# Patient Record
Sex: Female | Born: 1985 | Race: Black or African American | Hispanic: No | Marital: Single | Smoking: Never smoker
Health system: Southern US, Community
[De-identification: ages and names within clinical notes are randomized; demographics above are authoritative.]

## PROBLEM LIST (undated history)

## (undated) DIAGNOSIS — K219 Gastro-esophageal reflux disease without esophagitis: Secondary | ICD-10-CM

## (undated) DIAGNOSIS — L309 Dermatitis, unspecified: Secondary | ICD-10-CM

## (undated) DIAGNOSIS — G44009 Cluster headache syndrome, unspecified, not intractable: Secondary | ICD-10-CM

## (undated) HISTORY — DX: Dermatitis, unspecified: L30.9

## (undated) HISTORY — PX: TONSILLECTOMY: SUR1361

---
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
## 2009-10-05 ENCOUNTER — Inpatient Hospital Stay (HOSPITAL_COMMUNITY): Admission: AD | Admit: 2009-10-05 | Discharge: 2009-10-05 | Payer: Self-pay | Admitting: Family Medicine

## 2009-10-21 ENCOUNTER — Inpatient Hospital Stay (HOSPITAL_COMMUNITY): Admission: AD | Admit: 2009-10-21 | Discharge: 2009-10-21 | Payer: Self-pay | Admitting: Obstetrics & Gynecology

## 2010-06-07 ENCOUNTER — Ambulatory Visit: Payer: Self-pay | Admitting: Family Medicine

## 2010-06-07 ENCOUNTER — Other Ambulatory Visit: Admission: RE | Admit: 2010-06-07 | Discharge: 2010-06-07 | Payer: Self-pay | Admitting: Family Medicine

## 2010-06-07 DIAGNOSIS — L259 Unspecified contact dermatitis, unspecified cause: Secondary | ICD-10-CM

## 2010-06-07 DIAGNOSIS — J45909 Unspecified asthma, uncomplicated: Secondary | ICD-10-CM | POA: Insufficient documentation

## 2010-06-08 LAB — CONVERTED CEMR LAB
AST: 14 units/L (ref 0–37)
Alkaline Phosphatase: 48 units/L (ref 39–117)
BUN: 9 mg/dL (ref 6–23)
Basophils Absolute: 0 10*3/uL (ref 0.0–0.1)
Calcium: 9.3 mg/dL (ref 8.4–10.5)
Cholesterol: 150 mg/dL (ref 0–200)
GFR calc non Af Amer: 131.15 mL/min (ref >60)
Glucose, Bld: 73 mg/dL (ref 70–99)
HDL: 51.8 mg/dL (ref >39.00)
Lymphocytes Relative: 26.8 % (ref 12.0–46.0)
Monocytes Relative: 6.1 % (ref 3.0–12.0)
Neutrophils Relative %: 60.3 % (ref 43.0–77.0)
Platelets: 332 10*3/uL (ref 150.0–400.0)
RDW: 15.9 % — ABNORMAL HIGH (ref 11.5–14.6)
Sodium: 131 meq/L — ABNORMAL LOW (ref 135–145)
Total Bilirubin: 0.4 mg/dL (ref 0.3–1.2)
VLDL: 13.2 mg/dL (ref 0.0–40.0)

## 2010-08-05 ENCOUNTER — Inpatient Hospital Stay (HOSPITAL_COMMUNITY)
Admission: AD | Admit: 2010-08-05 | Discharge: 2010-08-05 | Payer: Self-pay | Source: Home / Self Care | Attending: Obstetrics and Gynecology | Admitting: Obstetrics and Gynecology

## 2010-08-12 NOTE — L&D Delivery Note (Signed)
Delivery Note At 1:03 AM a viable female was delivered via Vaginal, Spontaneous Delivery (Presentation: Middle Occiput Anterior).  APGAR: 9, 9; weight 8 lb 2.1 oz (3689 g).   Placenta status: Intact, Spontaneous.  Cord: 3 vessels with the following complications: None.  Anesthesia: Epidural  Episiotomy: None Lacerations: 2nd degree;Perineal Suture Repair: 3.0 Est. Blood Loss (mL):   Mom to postpartum.  Baby to nursery-stable.  Aneita Kiger 02/20/2011, 2:59 AM   Fetal tachycardia in last 30 minutes prior to delivery. Infant vigorous, placed skin-to-skin with mom, hot to touch. Mild odor to fluid at delivery. EBL: 400 Pt plans to breast feed. Desires IP circ. Placenta to pathology for suspected mild chorio.

## 2010-09-13 NOTE — Assessment & Plan Note (Signed)
Summary: NEW TO EST//FOR Beverly Scott FOR JOB//PH   Vital Signs:  Patient profile:   25 year old female Menstrual status:  regular LMP:     05/13/2010 Height:      64 inches Weight:      156.2 pounds BMI:     26.91 Temp:     97.6 degrees F oral Pulse rate:   80 / minute Pulse rhythm:   regular BP sitting:   100 / 68  (right arm) Cuff size:   regular  Vitals Entered By: Almeta Monas CMA Duncan Dull) (June 07, 2010 9:05 AM) CC: new est care, Cpx wants to get a pap today LMP (date): 05/13/2010     Menstrual Status regular Enter LMP: 05/13/2010   History of Present Illness: Pt here for cpe , pap and labs.  No complaints.    Preventive Screening-Counseling & Management  Alcohol-Tobacco     Alcohol drinks/day: <1     Smoking Status: never  Caffeine-Diet-Exercise     Caffeine use/day: 1     Does Patient Exercise: yes     Type of exercise: run     Exercise (avg: min/session): 30-60     Times/week: 3  Hep-HIV-STD-Contraception     Dental Visit-last 6 months no     Dental Care Counseling: to seek dental care; no dental care within six months     SBE monthly: no     SBE Education/Counseling: to perform regular SBE      Sexual History:  currently monogamous.        Drug Use:  no.    Current Medications (verified): 1)  Ventolin Hfa 108 (90 Base) Mcg/act Aers (Albuterol Sulfate) .... 2 Puffs Every 4 Hours As Needed 2)  Triamcinolone Acetonide 0.5 % Crea (Triamcinolone Acetonide) .... Apply To Affected Area Two Times A Day As Needed  Allergies (verified): 1)  ! Fluzone (Influenza Virus Vaccine Split) 2)  ! * Eggs  Past History:  Family History: Last updated: 06/07/2010 Family History of CAD Female 1st degree relative <60--Mother Family History Diabetes 1st degree relative--Mother Family History High cholesterol--Mother Family History Hypertension--Mother Family History of Sudden Death-, CHF-Mother  Social History: Last updated: 06/07/2010 Occupation:  CMA Single Never Smoked Alcohol use-yes-- Socially Drug use-no Regular exercise-yes--2x's per week  Risk Factors: Alcohol Use: <1 (06/07/2010) Caffeine Use: 1 (06/07/2010) Exercise: yes (06/07/2010)  Risk Factors: Smoking Status: never (06/07/2010)  Past Medical History: Asthma Eczema  Past Surgical History: Tonsillectomy-- 1990  Family History: Reviewed history and no changes required. Family History of CAD Female 1st degree relative <60--Mother Family History Diabetes 1st degree relative--Mother Family History High cholesterol--Mother Family History Hypertension--Mother Family History of Sudden Death-, CHF-Mother  Social History: Reviewed history and no changes required. Occupation: CMA Single Never Smoked Alcohol use-yes-- Socially Drug use-no Regular exercise-yes--2x's per week Occupation:  employed Smoking Status:  never Drug Use:  no Does Patient Exercise:  yes Alcohol:  Less than 3 drinks per week Caffeine use/day:  1 Dental Care w/in 6 mos.:  no Sexual History:  currently monogamous  Review of Systems      See HPI General:  Denies chills, fatigue, fever, loss of appetite, malaise, sleep disorder, sweats, weakness, and weight loss. Eyes:  Denies blurring, discharge, double vision, eye irritation, eye pain, halos, itching, light sensitivity, red eye, vision loss-1 eye, and vision loss-both eyes; optho--q2y. ENT:  Denies decreased hearing, difficulty swallowing, ear discharge, earache, hoarseness, nasal congestion, nosebleeds, postnasal drainage, ringing in ears, sinus pressure, and sore throat. CV:  Denies bluish discoloration of lips or nails, chest pain or discomfort, difficulty breathing at night, difficulty breathing while lying down, fainting, fatigue, leg cramps with exertion, lightheadness, near fainting, palpitations, shortness of breath with exertion, swelling of feet, swelling of hands, and weight gain. Resp:  Denies chest discomfort, chest pain  with inspiration, cough, coughing up blood, excessive snoring, hypersomnolence, morning headaches, pleuritic, shortness of breath, sputum productive, and wheezing. GI:  Denies abdominal pain, bloody stools, change in bowel habits, constipation, dark tarry stools, diarrhea, excessive appetite, gas, hemorrhoids, indigestion, loss of appetite, and nausea. GU:  Denies abnormal vaginal bleeding, decreased libido, discharge, dysuria, genital sores, hematuria, incontinence, nocturia, urinary frequency, and urinary hesitancy. MS:  Denies joint pain, joint redness, joint swelling, loss of strength, low back pain, mid back pain, muscle aches, muscle , cramps, muscle weakness, stiffness, and thoracic pain. Derm:  Denies changes in color of skin, changes in nail beds, dryness, excessive perspiration, flushing, hair loss, insect bite(s), itching, lesion(s), poor wound healing, and rash. Neuro:  Denies brief paralysis, difficulty with concentration, disturbances in coordination, falling down, headaches, inability to speak, memory loss, numbness, poor balance, seizures, sensation of room spinning, tingling, tremors, visual disturbances, and weakness. Psych:  Denies alternate hallucination ( auditory/visual), anxiety, depression, easily angered, easily tearful, irritability, mental problems, panic attacks, sense of great danger, suicidal thoughts/plans, thoughts of violence, unusual visions or sounds, and thoughts /plans of harming others. Endo:  Denies cold intolerance, excessive hunger, excessive thirst, excessive urination, heat intolerance, polyuria, and weight change. Heme:  Denies abnormal bruising, bleeding, enlarge lymph nodes, fevers, pallor, and skin discoloration. Allergy:  Denies hives or rash, itching eyes, persistent infections, seasonal allergies, and sneezing.  Physical Exam  General:  Well-developed,well-nourished,in no acute distress; alert,appropriate and cooperative throughout examination Head:   Normocephalic and atraumatic without obvious abnormalities. No apparent alopecia or balding. Eyes:  vision grossly intact, pupils equal, pupils round, pupils reactive to light, and no injection.   Ears:  External ear exam shows no significant lesions or deformities.  Otoscopic examination reveals clear canals, tympanic membranes are intact bilaterally without bulging, retraction, inflammation or discharge. Hearing is grossly normal bilaterally. Nose:  External nasal examination shows no deformity or inflammation. Nasal mucosa are pink and moist without lesions or exudates. Mouth:  Oral mucosa and oropharynx without lesions or exudates.  Teeth in good repair. Neck:  No deformities, masses, or tenderness noted. Chest Wall:  No deformities, masses, or tenderness noted. Breasts:  No mass, nodules, thickening, tenderness, bulging, retraction, inflamation, nipple discharge or skin changes noted.   Lungs:  Normal respiratory effort, chest expands symmetrically. Lungs are clear to auscultation, no crackles or wheezes. Heart:  Normal rate and regular rhythm. S1 and S2 normal without gallop, murmur, click, rub or other extra sounds. Abdomen:  Bowel sounds positive,abdomen soft and non-tender without masses, organomegaly or hernias noted. Genitalia:  Pelvic Exam:        External: normal female genitalia without lesions or masses        Vagina: normal without lesions or masses        Cervix: normal without lesions or masses        Adnexa: normal bimanual exam without masses or fullness        Uterus: normal by palpation        Pap smear: performed  white d/c--scant amount, no odor Msk:  normal ROM, no joint tenderness, no joint swelling, no joint warmth, no redness over joints, no joint deformities, no joint instability, and  no crepitation.   Pulses:  R and L carotid,radial,femoral,dorsalis pedis and posterior tibial pulses are full and equal bilaterally Extremities:  No clubbing, cyanosis, edema, or  deformity noted with normal full range of motion of all joints.   Neurologic:  No cranial nerve deficits noted. Station and gait are normal. Plantar reflexes are down-going bilaterally. DTRs are symmetrical throughout. Sensory, motor and coordinative functions appear intact. Skin:  Intact without suspicious lesions or rashes Cervical Nodes:  No lymphadenopathy noted Axillary Nodes:  No palpable lymphadenopathy Psych:  Cognition and judgment appear intact. Alert and cooperative with normal attention span and concentration. No apparent delusions, illusions, hallucinations   Impression & Recommendations:  Problem # 1:  PREVENTIVE HEALTH CARE (ICD-V70.0) ghm utd pt is allergic to flu shots Orders: Venipuncture (04540) TLB-Lipid Panel (80061-LIPID) TLB-BMP (Basic Metabolic Panel-BMET) (80048-METABOL) TLB-CBC Platelet - w/Differential (85025-CBCD) TLB-Hepatic/Liver Function Pnl (80076-HEPATIC) TLB-TSH (Thyroid Stimulating Hormone) (84443-TSH)  Problem # 2:  Hx of ECZEMA (ICD-692.9)  Her updated medication list for this problem includes:    Triamcinolone Acetonide 0.5 % Crea (Triamcinolone acetonide) .Marland Kitchen... Apply to affected area two times a day as needed  Discussed avoidance of triggers and symptomatic treatment.   Complete Medication List: 1)  Ventolin Hfa 108 (90 Base) Mcg/act Aers (Albuterol sulfate) .... 2 puffs every 4 hours as needed 2)  Triamcinolone Acetonide 0.5 % Crea (Triamcinolone acetonide) .... Apply to affected area two times a day as needed  Patient Instructions: 1)  Please schedule a follow-up appointment in 1 year.  Prescriptions: TRIAMCINOLONE ACETONIDE 0.5 % CREA (TRIAMCINOLONE ACETONIDE) APPLY TO AFFECTED AREA two times a day as needed  #45g x 3   Entered and Authorized by:   Loreen Freud DO   Signed by:   Loreen Freud DO on 06/07/2010   Method used:   Electronically to        [facility] # [phone]* (retail)       [address] [place], [place]   [address]       Ph: [phone]       Fax: [phone]   RxID:   [id number]    Orders Added: 1)  Venipuncture [66440] 2)  TLB-Lipid Panel [80061-LIPID] 3)  TLB-BMP (Basic Metabolic Panel-BMET) [80048-METABOL] 4)  TLB-CBC Platelet - w/Differential [85025-CBCD] 5)  TLB-Hepatic/Liver Function Pnl [80076-HEPATIC] 6)  TLB-TSH (Thyroid Stimulating Hormone) [84443-TSH] 7)  New Patient 18-39 years [99385]   Immunization History:  Tetanus/Td Immunization History:    Tetanus/Td:  tdap (01/12/2009)   Immunization History:  Tetanus/Td Immunization History:    Tetanus/Td:  Tdap (01/12/2009)    Flu Vaccine Next Due:  Refused

## 2010-09-13 NOTE — Miscellaneous (Signed)
Summary: Vaccine Records  Vaccine Records   Imported By: Lanelle Bal 06/15/2010 09:02:06  _____________________________________________________________________  External Attachment:    Type:   Image     Comment:   External Document

## 2010-10-22 LAB — URINALYSIS, ROUTINE W REFLEX MICROSCOPIC
Glucose, UA: NEGATIVE mg/dL
Hgb urine dipstick: NEGATIVE
Specific Gravity, Urine: >1.03 — ABNORMAL HIGH (ref 1.005–1.030)
Urobilinogen, UA: 0.2 mg/dL (ref 0.0–1.0)

## 2010-10-22 LAB — DIFFERENTIAL
Basophils Absolute: 0 10*3/uL (ref 0.0–0.1)
Basophils Relative: 0 % (ref 0–1)
Eosinophils Absolute: 0.3 10*3/uL (ref 0.0–0.7)
Eosinophils Relative: 3 % (ref 0–5)
Monocytes Absolute: 0.5 10*3/uL (ref 0.1–1.0)
Monocytes Relative: 5 % (ref 3–12)

## 2010-10-22 LAB — GC/CHLAMYDIA PROBE AMP, GENITAL
Chlamydia, DNA Probe: POSITIVE — AB
GC Probe Amp, Genital: NEGATIVE

## 2010-10-22 LAB — COMPREHENSIVE METABOLIC PANEL
ALT: 11 U/L (ref 0–35)
AST: 20 U/L (ref 0–37)
Albumin: 3.3 g/dL — ABNORMAL LOW (ref 3.5–5.2)
Alkaline Phosphatase: 42 U/L (ref 39–117)
BUN: 2 mg/dL — ABNORMAL LOW (ref 6–23)
Chloride: 103 mEq/L (ref 96–112)
GFR calc Af Amer: >60 mL/min (ref >60)
Potassium: 3.5 mEq/L (ref 3.5–5.1)
Sodium: 135 mEq/L (ref 135–145)
Total Bilirubin: 0.4 mg/dL (ref 0.3–1.2)
Total Protein: 6.2 g/dL (ref 6.0–8.3)

## 2010-10-22 LAB — CBC
MCV: 81.8 fL (ref 78.0–100.0)
Platelets: 314 10*3/uL (ref 150–400)
RBC: 4.35 MIL/uL (ref 3.87–5.11)
WBC: 10.6 10*3/uL — ABNORMAL HIGH (ref 4.0–10.5)

## 2010-10-22 LAB — WET PREP, GENITAL
Trich, Wet Prep: NONE SEEN
Yeast Wet Prep HPF POC: NONE SEEN

## 2010-10-30 ENCOUNTER — Inpatient Hospital Stay (HOSPITAL_COMMUNITY)
Admission: AD | Admit: 2010-10-30 | Discharge: 2010-10-30 | Disposition: A | Discharge status: Home / Self Care | Payer: BC Managed Care – PPO | Source: Ambulatory Visit | Attending: Obstetrics and Gynecology | Admitting: Obstetrics and Gynecology

## 2010-10-30 DIAGNOSIS — K5289 Other specified noninfective gastroenteritis and colitis: Secondary | ICD-10-CM | POA: Insufficient documentation

## 2010-10-30 DIAGNOSIS — O99891 Other specified diseases and conditions complicating pregnancy: Secondary | ICD-10-CM | POA: Insufficient documentation

## 2010-10-30 DIAGNOSIS — O212 Late vomiting of pregnancy: Secondary | ICD-10-CM | POA: Insufficient documentation

## 2010-10-30 LAB — COMPREHENSIVE METABOLIC PANEL
Albumin: 1.6 g/dL — ABNORMAL LOW (ref 3.5–5.2)
Alkaline Phosphatase: 94 U/L (ref 39–117)
BUN: 6 mg/dL (ref 6–23)
Chloride: 116 mEq/L — ABNORMAL HIGH (ref 96–112)
Creatinine, Ser: 0.91 mg/dL (ref 0.4–1.2)
Glucose, Bld: 57 mg/dL — ABNORMAL LOW (ref 70–99)
Potassium: 4.4 mEq/L (ref 3.5–5.1)
Total Bilirubin: 0.9 mg/dL (ref 0.3–1.2)
Total Protein: 5.9 g/dL — ABNORMAL LOW (ref 6.0–8.3)

## 2010-10-30 LAB — URINALYSIS, ROUTINE W REFLEX MICROSCOPIC
Bilirubin Urine: NEGATIVE
Ketones, ur: NEGATIVE mg/dL
Nitrite: NEGATIVE
Protein, ur: NEGATIVE mg/dL
pH: 7 (ref 5.0–8.0)

## 2010-10-30 LAB — CBC
MCH: 29.5 pg (ref 26.0–34.0)
MCHC: 33.2 g/dL (ref 30.0–36.0)
MCV: 88.9 fL (ref 78.0–100.0)
Platelets: 290 10*3/uL (ref 150–400)

## 2010-11-02 LAB — URINALYSIS, ROUTINE W REFLEX MICROSCOPIC
Bilirubin Urine: NEGATIVE
Ketones, ur: NEGATIVE mg/dL
Nitrite: NEGATIVE
Protein, ur: NEGATIVE mg/dL
Urobilinogen, UA: 0.2 mg/dL (ref 0.0–1.0)

## 2010-11-02 LAB — CBC
HCT: 36.7 % (ref 36.0–46.0)
Hemoglobin: 12.2 g/dL (ref 12.0–15.0)
MCHC: 33.2 g/dL (ref 30.0–36.0)
MCV: 86.7 fL (ref 78.0–100.0)
RBC: 4.24 MIL/uL (ref 3.87–5.11)
RDW: 14.1 % (ref 11.5–15.5)

## 2010-11-02 LAB — ABO/RH: ABO/RH(D): A POS

## 2010-11-02 LAB — GC/CHLAMYDIA PROBE AMP, GENITAL
Chlamydia, DNA Probe: NEGATIVE
GC Probe Amp, Genital: NEGATIVE

## 2010-11-05 LAB — URINALYSIS, ROUTINE W REFLEX MICROSCOPIC
Bilirubin Urine: NEGATIVE
Glucose, UA: NEGATIVE mg/dL
Hgb urine dipstick: NEGATIVE
Nitrite: NEGATIVE
Specific Gravity, Urine: >1.03 — ABNORMAL HIGH (ref 1.005–1.030)
pH: 5.5 (ref 5.0–8.0)

## 2010-11-07 ENCOUNTER — Encounter: Payer: Self-pay | Admitting: Family Medicine

## 2010-11-07 ENCOUNTER — Other Ambulatory Visit: Payer: Self-pay | Admitting: *Deleted

## 2010-11-07 MED ORDER — ALBUTEROL SULFATE HFA 108 (90 BASE) MCG/ACT IN AERS: 2.0000 | INHALATION_SPRAY | RESPIRATORY_TRACT | Status: DC | PRN

## 2010-11-17 ENCOUNTER — Inpatient Hospital Stay (HOSPITAL_COMMUNITY): Payer: BC Managed Care – PPO

## 2010-11-17 ENCOUNTER — Inpatient Hospital Stay (HOSPITAL_COMMUNITY)
Admission: AD | Admit: 2010-11-17 | Discharge: 2010-11-17 | Disposition: A | Discharge status: Home / Self Care | Payer: BC Managed Care – PPO | Source: Ambulatory Visit | Attending: Obstetrics and Gynecology | Admitting: Obstetrics and Gynecology

## 2010-11-17 DIAGNOSIS — O4700 False labor before 37 completed weeks of gestation, unspecified trimester: Secondary | ICD-10-CM | POA: Insufficient documentation

## 2010-11-17 LAB — URINALYSIS, ROUTINE W REFLEX MICROSCOPIC
Glucose, UA: NEGATIVE mg/dL
Ketones, ur: 15 mg/dL — AB
Protein, ur: NEGATIVE mg/dL
Urobilinogen, UA: 0.2 mg/dL (ref 0.0–1.0)

## 2010-11-17 LAB — WET PREP, GENITAL
Trich, Wet Prep: NONE SEEN
Yeast Wet Prep HPF POC: NONE SEEN

## 2010-11-17 LAB — URINE MICROSCOPIC-ADD ON

## 2010-11-17 LAB — FETAL FIBRONECTIN: Fetal Fibronectin: NEGATIVE

## 2010-11-19 LAB — STREP B DNA PROBE: Strep Group B Ag: NEGATIVE

## 2010-11-19 LAB — GC/CHLAMYDIA PROBE AMP, GENITAL: Chlamydia, DNA Probe: NEGATIVE

## 2010-12-25 ENCOUNTER — Other Ambulatory Visit (HOSPITAL_COMMUNITY): Payer: Self-pay | Admitting: Obstetrics and Gynecology

## 2010-12-25 DIAGNOSIS — O358XX0 Maternal care for other (suspected) fetal abnormality and damage, not applicable or unspecified: Secondary | ICD-10-CM

## 2010-12-25 DIAGNOSIS — Z0489 Encounter for examination and observation for other specified reasons: Secondary | ICD-10-CM

## 2010-12-28 ENCOUNTER — Ambulatory Visit (HOSPITAL_COMMUNITY)
Admission: RE | Admit: 2010-12-28 | Discharge: 2010-12-28 | Disposition: A | Discharge status: Home / Self Care | Payer: BC Managed Care – PPO | Source: Ambulatory Visit | Attending: Obstetrics and Gynecology | Admitting: Obstetrics and Gynecology

## 2010-12-28 ENCOUNTER — Encounter (HOSPITAL_COMMUNITY): Payer: Self-pay

## 2010-12-28 DIAGNOSIS — IMO0002 Reserved for concepts with insufficient information to code with codable children: Secondary | ICD-10-CM

## 2010-12-28 DIAGNOSIS — O35EXX0 Maternal care for other (suspected) fetal abnormality and damage, fetal genitourinary anomalies, not applicable or unspecified: Secondary | ICD-10-CM

## 2010-12-28 DIAGNOSIS — O358XX0 Maternal care for other (suspected) fetal abnormality and damage, not applicable or unspecified: Secondary | ICD-10-CM | POA: Insufficient documentation

## 2010-12-28 DIAGNOSIS — Z0489 Encounter for examination and observation for other specified reasons: Secondary | ICD-10-CM

## 2010-12-28 DIAGNOSIS — Z3689 Encounter for other specified antenatal screening: Secondary | ICD-10-CM | POA: Insufficient documentation

## 2010-12-31 ENCOUNTER — Ambulatory Visit (HOSPITAL_COMMUNITY)
Admission: RE | Admit: 2010-12-31 | Discharge: 2010-12-31 | Disposition: A | Discharge status: Home / Self Care | Payer: BC Managed Care – PPO | Source: Ambulatory Visit | Attending: Obstetrics and Gynecology | Admitting: Obstetrics and Gynecology

## 2010-12-31 DIAGNOSIS — M79609 Pain in unspecified limb: Secondary | ICD-10-CM

## 2010-12-31 DIAGNOSIS — O99891 Other specified diseases and conditions complicating pregnancy: Secondary | ICD-10-CM | POA: Insufficient documentation

## 2011-01-02 ENCOUNTER — Other Ambulatory Visit (HOSPITAL_COMMUNITY): Payer: BC Managed Care – PPO

## 2011-01-02 ENCOUNTER — Encounter (HOSPITAL_COMMUNITY): Payer: BC Managed Care – PPO

## 2011-02-17 ENCOUNTER — Encounter (HOSPITAL_COMMUNITY): Payer: Self-pay | Admitting: *Deleted

## 2011-02-17 ENCOUNTER — Inpatient Hospital Stay (HOSPITAL_COMMUNITY)
Admission: AD | Admit: 2011-02-17 | Discharge: 2011-02-17 | Disposition: A | Discharge status: Home / Self Care | Payer: BC Managed Care – PPO | Source: Ambulatory Visit | Attending: Obstetrics and Gynecology | Admitting: Obstetrics and Gynecology

## 2011-02-17 DIAGNOSIS — O368190 Decreased fetal movements, unspecified trimester, not applicable or unspecified: Secondary | ICD-10-CM | POA: Insufficient documentation

## 2011-02-17 DIAGNOSIS — O99891 Other specified diseases and conditions complicating pregnancy: Secondary | ICD-10-CM | POA: Insufficient documentation

## 2011-02-17 DIAGNOSIS — W108XXA Fall (on) (from) other stairs and steps, initial encounter: Secondary | ICD-10-CM | POA: Insufficient documentation

## 2011-02-17 DIAGNOSIS — W19XXXA Unspecified fall, initial encounter: Secondary | ICD-10-CM

## 2011-02-17 DIAGNOSIS — R109 Unspecified abdominal pain: Secondary | ICD-10-CM | POA: Insufficient documentation

## 2011-02-17 MED ORDER — ACETAMINOPHEN-CODEINE 300-30 MG PO TABS: 1.0000 | ORAL_TABLET | ORAL | Status: DC | PRN

## 2011-02-17 MED ORDER — HYDROCODONE-ACETAMINOPHEN 5-325 MG PO TABS
2.0000 | ORAL_TABLET | ORAL | Status: AC
  Administered 2011-02-17: 2 via ORAL
  Filled 2011-02-17: qty 2

## 2011-02-17 NOTE — ED Provider Notes (Signed)
History   25y.o. SBF G2P0010 at 39.6 per [facility] 02/18/11, presents unannounced with CC of fall around 6-7pm.  Carrying water up outside stairs to her apartment.  During fall, hit left knee on step, guarded abdomen with right hand, & braced fall with Left hand.  Denies VB, LOF, abnl d/c.  Denies UTI s/s, GI, or resp c/o's.  Decreased FM since fall, but since arrival to [facility]U, fetal movement has picked up.  Ate last between 4-5 o'clock.  Cx per pt recall 1.5 last Monday at office.   Patient denies cuts, breaks in skin.  Reports generalized pain in abdomen & low back, & right knee.  Chief Complaint  Patient presents with  . Abdominal Pain   Abdominal Pain This is a new problem. The current episode started today. The onset quality is gradual. The problem occurs constantly. The problem has been gradually worsening. The pain is located in the generalized abdominal region. The pain is at a severity of 8/10. The pain is moderate. The quality of the pain is aching and dull. The abdominal pain does not radiate. The pain is aggravated by certain positions. The pain is relieved by nothing. She has tried nothing for the symptoms.     Past Medical History  Diagnosis Date  . Asthma   . Eczema     Past Surgical History  Procedure Date  . Tonsillectomy 1990    Family History  Problem Relation Age of Onset  . Coronary artery disease Mother     <60  . Diabetes Mother   . Hyperlipidemia Mother   . Hypertension Mother   . Heart failure Mother     Sudden Death    History  Substance Use Topics  . Smoking status: Never Smoker   . Smokeless tobacco: Not on file  . Alcohol Use: No    Allergies:  Allergies  Allergen Reactions  . Fish-Derived Products Hives and Shortness Of Breath  . Eggs Or Egg-Derived Products   . Fluzone Other (See Comments)    Reaction to fish products  . Latex Rash    Prescriptions prior to admission  Medication Sig Dispense Refill  . albuterol (VENTOLIN HFA) 108 (90 BASE)  MCG/ACT inhaler Inhale 2 puffs into the lungs every 4 (four) hours as needed for wheezing.  1 Inhaler  2  . guaiFENesin (MUCINEX) 600 MG 12 hr tablet Take 1,200 mg by mouth 2 (two) times daily.        . pantoprazole (PROTONIX) 40 MG tablet Take 40 mg by mouth daily.        . prenatal vitamin w/FE, FA (PRENATAL 1 + 1) 27-1 MG TABS Take 1 tablet by mouth daily.        . promethazine (PHENERGAN) 25 MG tablet Take 25 mg by mouth every 6 (six) hours as needed.        . triamcinolone (KENALOG) 0.5 % cream Apply 1 application topically daily as needed. to affected area        Review of Systems  Constitutional: Negative.   HENT: Negative.   Respiratory: Negative.   Gastrointestinal: Positive for abdominal pain.  Genitourinary: Negative.   Musculoskeletal: Positive for back pain and joint pain.  Skin: Negative.    Physical Exam   Blood pressure 102/65, pulse 115, temperature 98.6 F (37 C), temperature source Oral, resp. rate 20, height 5\' 3"  (1.6 m), weight 89.994 kg (198 lb 6.4 oz).  Physical Exam  Constitutional: She appears well-developed and well-nourished.  Cardiovascular: Normal rate  and regular rhythm.   Respiratory: Effort normal.  GI: Soft. Bowel sounds are normal.  Musculoskeletal: Normal range of motion. She exhibits no edema.  Skin: Skin is warm and dry.   FETAL ASSESSMENT: FHR with baseline of 140, moderate variability, reactive, with no decels TOCO: Mild-moderate contractions every 2-10 minutes   MAU Course  Procedures  1.  Prolonged external fetal monitoring 2. Pain management:  Pain decreased to 4/10 after Vicodin 2 tabs po x1 3.  Cervical exam  ASSESSMENT:   1. IUP at 39.6 weeks 2.  S/p fall 3.  RH pos 4.  Reactive & reassuring FHT--Cate I 5.  No change in cx dilatation since last exam 1 week ago 6.  Pain improved w/ 2 Vicodin tabs  PLAN: 1.  D/C home--labor precautions & FKC 2.  Rx given for Tylenol #3 3.  F/u Tuesday as scheduled or prn worsening s/s,  concerns      Blessing Ozga H

## 2011-02-17 NOTE — Progress Notes (Signed)
Carrying some groceries up the stairs to her apartment, and fell,  Every since she fell she has been having some pain in her back.  Caught herself with her hands.  Back and lower abd with a lot of pressure, hurts more with walking or standing up.  Happened app a little bit before seven

## 2011-02-17 NOTE — Initial Assessments (Signed)
Pt reports falling this evening around 1830, landing on right knee and slightly hitting abd on stairs.  Pt reports decreased fetal movement, abd pain and pressure and lower back pain.

## 2011-02-19 ENCOUNTER — Encounter (HOSPITAL_COMMUNITY): Payer: Self-pay | Admitting: Anesthesiology

## 2011-02-19 ENCOUNTER — Encounter (HOSPITAL_COMMUNITY): Payer: Self-pay | Admitting: *Deleted

## 2011-02-19 ENCOUNTER — Inpatient Hospital Stay (HOSPITAL_COMMUNITY): Payer: BC Managed Care – PPO | Admitting: Anesthesiology

## 2011-02-19 ENCOUNTER — Inpatient Hospital Stay (HOSPITAL_COMMUNITY)
Admission: AD | Admit: 2011-02-19 | Discharge: 2011-02-22 | DRG: 372 | Disposition: A | Discharge status: Home / Self Care | Payer: BC Managed Care – PPO | Source: Ambulatory Visit | Attending: Obstetrics and Gynecology | Admitting: Obstetrics and Gynecology

## 2011-02-19 DIAGNOSIS — D649 Anemia, unspecified: Secondary | ICD-10-CM | POA: Diagnosis present

## 2011-02-19 DIAGNOSIS — O4290 Premature rupture of membranes, unspecified as to length of time between rupture and onset of labor, unspecified weeks of gestation: Secondary | ICD-10-CM | POA: Diagnosis present

## 2011-02-19 DIAGNOSIS — O358XX0 Maternal care for other (suspected) fetal abnormality and damage, not applicable or unspecified: Secondary | ICD-10-CM | POA: Diagnosis present

## 2011-02-19 HISTORY — DX: Gastro-esophageal reflux disease without esophagitis: K21.9

## 2011-02-19 LAB — WET PREP, GENITAL: Trich, Wet Prep: NONE SEEN

## 2011-02-19 LAB — ABO/RH

## 2011-02-19 LAB — CBC
HCT: 34.1 % — ABNORMAL LOW (ref 36.0–46.0)
MCHC: 33.1 g/dL (ref 30.0–36.0)
Platelets: 284 10*3/uL (ref 150–400)
RDW: 14.7 % (ref 11.5–15.5)

## 2011-02-19 LAB — TYPE AND SCREEN: Antibody Screen: NEGATIVE

## 2011-02-19 LAB — HEPATITIS B SURFACE ANTIGEN: Hepatitis B Surface Ag: NEGATIVE

## 2011-02-19 LAB — AMNISURE RUPTURE OF MEMBRANE (ROM) NOT AT ARMC: Amnisure ROM: POSITIVE

## 2011-02-19 LAB — RUBELLA ANTIBODY, IGM: Rubella: NON-IMMUNE/NOT IMMUNE

## 2011-02-19 MED ORDER — IBUPROFEN 600 MG PO TABS
600.0000 mg | ORAL_TABLET | Freq: Four times a day (QID) | ORAL | Status: DC | PRN
  Administered 2011-02-22: 600 mg via ORAL
  Filled 2011-02-19 (×5): qty 1

## 2011-02-19 MED ORDER — ACETAMINOPHEN 325 MG PO TABS: 650.0000 mg | ORAL_TABLET | ORAL | Status: DC | PRN

## 2011-02-19 MED ORDER — MISOPROSTOL 25 MCG QUARTER TABLET
25.0000 ug | ORAL_TABLET | ORAL | Status: DC | PRN
  Administered 2011-02-19: 25 ug via VAGINAL
  Filled 2011-02-19: qty 0.25
  Filled 2011-02-19: qty 1

## 2011-02-19 MED ORDER — PHENYLEPHRINE 40 MCG/ML (10ML) SYRINGE FOR IV PUSH (FOR BLOOD PRESSURE SUPPORT)
80.0000 ug | PREFILLED_SYRINGE | INTRAVENOUS | Status: DC | PRN
  Filled 2011-02-19 (×2): qty 5

## 2011-02-19 MED ORDER — FENTANYL 2.5 MCG/ML BUPIVACAINE 1/10 % EPIDURAL INFUSION (WH - ANES)
2.0000 mL/h | INTRAMUSCULAR | Status: DC
  Administered 2011-02-19 (×3): 14 mL/h via EPIDURAL
  Filled 2011-02-19 (×3): qty 60

## 2011-02-19 MED ORDER — SODIUM CHLORIDE 0.9 % IJ SOLN
INTRAMUSCULAR | Status: AC
  Filled 2011-02-19: qty 3

## 2011-02-19 MED ORDER — FLEET ENEMA 7-19 GM/118ML RE ENEM: 1.0000 | ENEMA | RECTAL | Status: DC | PRN

## 2011-02-19 MED ORDER — PHENYLEPHRINE 40 MCG/ML (10ML) SYRINGE FOR IV PUSH (FOR BLOOD PRESSURE SUPPORT)
80.0000 ug | PREFILLED_SYRINGE | INTRAVENOUS | Status: DC | PRN
  Filled 2011-02-19: qty 5

## 2011-02-19 MED ORDER — CITRIC ACID-SODIUM CITRATE 334-500 MG/5ML PO SOLN: 30.0000 mL | ORAL | Status: DC | PRN

## 2011-02-19 MED ORDER — OXYTOCIN 20 UNITS IN LACTATED RINGERS INFUSION - SIMPLE
125.0000 mL/h | Freq: Once | INTRAVENOUS | Status: DC
  Filled 2011-02-19 (×2): qty 1000

## 2011-02-19 MED ORDER — LACTATED RINGERS IV SOLN: 500.0000 mL | Freq: Once | INTRAVENOUS | Status: AC | PRN

## 2011-02-19 MED ORDER — LACTATED RINGERS IV SOLN
500.0000 mL | Freq: Once | INTRAVENOUS | Status: AC
  Administered 2011-02-19: 1000 mL via INTRAVENOUS

## 2011-02-19 MED ORDER — LIDOCAINE HCL (PF) 2 % IJ SOLN
INTRAMUSCULAR | Status: DC | PRN
  Administered 2011-02-19 (×3): 40 mg

## 2011-02-19 MED ORDER — DIPHENHYDRAMINE HCL 50 MG/ML IJ SOLN
12.5000 mg | INTRAMUSCULAR | Status: DC | PRN
  Administered 2011-02-19: 12.5 mg via INTRAVENOUS
  Filled 2011-02-19: qty 1

## 2011-02-19 MED ORDER — NALBUPHINE SYRINGE 5 MG/0.5 ML
5.0000 mg | INJECTION | INTRAMUSCULAR | Status: DC | PRN
  Administered 2011-02-19 (×2): 5 mg via INTRAVENOUS
  Filled 2011-02-19 (×3): qty 0.5

## 2011-02-19 MED ORDER — TERBUTALINE SULFATE 1 MG/ML IJ SOLN: 0.2500 mg | Freq: Once | INTRAMUSCULAR | Status: AC | PRN

## 2011-02-19 MED ORDER — OXYTOCIN 20 UNITS IN LACTATED RINGERS INFUSION - SIMPLE
1.0000 m[IU]/min | INTRAVENOUS | Status: DC
  Administered 2011-02-19: 6 m[IU]/min via INTRAVENOUS
  Administered 2011-02-19: 2 m[IU]/min via INTRAVENOUS

## 2011-02-19 MED ORDER — SODIUM CHLORIDE 0.9 % IJ SOLN
3.0000 mL | Freq: Two times a day (BID) | INTRAMUSCULAR | Status: DC
  Administered 2011-02-20: 3 mL via INTRAVENOUS

## 2011-02-19 MED ORDER — ONDANSETRON HCL 4 MG/2ML IJ SOLN: 4.0000 mg | Freq: Four times a day (QID) | INTRAMUSCULAR | Status: DC | PRN

## 2011-02-19 MED ORDER — LACTATED RINGERS IV SOLN
INTRAVENOUS | Status: DC
  Administered 2011-02-19: 23:00:00 via INTRAVENOUS
  Administered 2011-02-19: 500 mL via INTRAVENOUS
  Administered 2011-02-19: 13:00:00 via INTRAVENOUS

## 2011-02-19 MED ORDER — ALBUTEROL SULFATE HFA 108 (90 BASE) MCG/ACT IN AERS
2.0000 | INHALATION_SPRAY | RESPIRATORY_TRACT | Status: DC | PRN
  Filled 2011-02-19 (×2): qty 6.7

## 2011-02-19 MED ORDER — EPHEDRINE 5 MG/ML INJ
10.0000 mg | INTRAVENOUS | Status: DC | PRN
  Filled 2011-02-19: qty 4

## 2011-02-19 MED ORDER — LIDOCAINE HCL (PF) 1 % IJ SOLN
30.0000 mL | Freq: Once | INTRAMUSCULAR | Status: AC | PRN
  Filled 2011-02-19: qty 30

## 2011-02-19 MED ORDER — ERYTHROMYCIN 5 MG/GM OP OINT
TOPICAL_OINTMENT | OPHTHALMIC | Status: AC
  Administered 2011-02-20: 1
  Filled 2011-02-19: qty 1

## 2011-02-19 MED ORDER — EPHEDRINE 5 MG/ML INJ
10.0000 mg | INTRAVENOUS | Status: DC | PRN
  Filled 2011-02-19 (×2): qty 4

## 2011-02-19 MED ORDER — LACTATED RINGERS IV SOLN: INTRAVENOUS | Status: DC

## 2011-02-19 NOTE — H&P (Signed)
Beverly Scott is a 25 y.o. female presenting for leaking of clear fluid since midnight and mild contractions. Maternal Medical History:  Reason for admission: Reason for admission: rupture of membranes.  Contractions: Onset was 1 week ago.   Frequency: irregular.   Perceived severity is mild.    Fetal activity: Perceived fetal activity is normal.   Last perceived fetal movement was within the past hour.    Prenatal complications: Left fetal renal pyelectasis and ?   Left renal Pyelectasis Hx 2nd trimester TAB 1st trimester CT OB History    Grav Para Term Preterm Abortions TAB SAB Ect Mult Living   2    1 1     0     Past Medical History  Diagnosis Date  . Asthma   . Eczema    Past Surgical History  Procedure Date  . Tonsillectomy 1990  . Tonsillectomy    Family History: family history includes Coronary artery disease in her mother; Diabetes in her mother; Heart failure in her mother; Hyperlipidemia in her mother; and Hypertension in her mother. Social History:  reports that she has never smoked. She does not have any smokeless tobacco history on file. She reports that she does not drink alcohol or use illicit drugs.  Review of Systems  All other systems reviewed and are negative.    Dilation: 1.5 Effacement (%): Thick Station: -3 Exam by:: [place], CNM Blood pressure 99/64, pulse 94, temperature 98.1 F (36.7 C), temperature source Oral, resp. rate 18, height 5\' 3"  (1.6 m), weight 90.266 kg (199 lb), last menstrual period 05/13/2010. Fern: equivocal  Amnisure: pos Maternal Exam:  Uterine Assessment: Contraction strength is mild.  Contraction frequency is irregular.   Abdomen: Patient reports no abdominal tenderness. Introitus: Vaginal discharge: thin watery-white odorless     Physical Exam  Genitourinary: Enlarged: gravid, S=D. No bleeding around the vagina. Vaginal discharge: thin watery-white odorless     Prenatal labs: ABO, Rh:  A pos Antibody:  Negative (07/10 0000) Rubella:  Immune RPR: Nonreactive (07/10 0000)  HBsAg: Negative (07/10 0000)  HIV: Non-reactive (07/10 0000)  GBS: NEGATIVE (04/07 2122)  First Trimester screen: Normal AFP: neg Hgb Electrophoresis: Normal GC: neg CT: pos, TOC: neg 1-hour GTT: 87 18 week [place]: L renal pyelectasis 32 week [place]: Pyelectasis resolved. ? abnl ML fluid collection in brain vs 3rd ventrical 34.2 week [place] at [facility] :Perinatal: Cavum vergae (normal variant) noted. No pyelectasis, but possible  double left renal pelvis   Assessment/Plan: Assessment: 1. 40.1 week IUP 2 FHR category 1 3. PROM 4. [place] abnormalities  Plan:  1. Admit to BS per consult with Dr. Normand Sloop 2. Cytotec for for cervical ripening 3. Routine CNM orders 4. PP imaging of infant's kidneys   5. Analgesia/anesthesia PRN  Lafern Brinkley 02/19/2011, 03:00 AM

## 2011-02-19 NOTE — Progress Notes (Signed)
Beverly Scott is a 25 y.o. G2P0010 at [redacted]w[redacted]d by LMP admitted for rupture of membranes  Subjective:   Objective: BP 99/64  Pulse 94  Temp(Src) 98.1 F (36.7 C) (Oral)  Resp 18  Ht 5\' 3"  (1.6 m)  Wt 199 lb (90.266 kg)  BMI 35.25 kg/m2  LMP 05/13/2010      FHT:  FHR: 125 bpm, variability: moderate,  accelerations:  Present,  decelerations:  Absent UC:   irregular, every 4-5 minutes SVE:   Dilation: 1.5 Effacement (%): 50 Station: -2 Exam by:: dherr rn  Labs: Lab Results  Component Value Date   WBC 10.0 02/19/2011   HGB 11.3* 02/19/2011   HCT 34.1* 02/19/2011   MCV 87.0 02/19/2011   PLT 284 02/19/2011    Assessment / Plan: SROM, Cytotec induction  Labor: Latent Preeclampsia:  no signs or symptoms of toxicity, intake and ouput balanced and labs stable Fetal Wellbeing:  Category I Pain Control:  Labor support without medications and eventually will desire epidural for pain managemtn Anticipated MOD:  NSVD  Cytotec placed per RN. Plan of care reviewed with pt. Verbalizes understanding. Sister @ BS for support. Reg diet served.   [name] 02/19/2011, 8:47 AM

## 2011-02-19 NOTE — Anesthesia Preprocedure Evaluation (Addendum)
Anesthesia Evaluation  Name, MR# and DOB Patient awake  General Assessment Comment  Airway Mallampati: II TM Distance: >3 FB     Dental   Pulmonary  asthma (rare usage of inhaler) and Inhaler asthma  clear to auscultation    Cardiovascular regular Normal   Neuro/Psych  GI/Hepatic/Renal (+)  GERD Medicated and Controlled     Endo/Other   (+)  Morbid obesity Abdominal   Musculoskeletal  Hematology   Peds  Reproductive/Obstetrics   Anesthesia Other Findings             Anesthesia Physical Anesthesia Plan  ASA: II  Anesthesia Plan: Epidural   Post-op Pain Management:    Induction:   Airway Management Planned:   Additional Equipment:   Intra-op Plan:   Post-operative Plan:   Informed Consent:   Plan Discussed with:   Anesthesia Plan Comments:         Anesthesia Quick Evaluation

## 2011-02-19 NOTE — Initial Assessments (Signed)
Thinks ROM @ 0005 tonight, clear.  40wks, G1p0

## 2011-02-19 NOTE — Progress Notes (Signed)
Beverly Scott is a 25 y.o. G2P0010 at [redacted]w[redacted]d by LMP admitted for PROM  Subjective:  Resting comfortably with eyes closed. No current c/o.   Pitocin infusion @ 2 mu/min     Objective: BP 114/78  Pulse 89  Temp(Src) 98.5 F (36.9 C) (Oral)  Resp 16  Ht 5\' 3"  (1.6 m)  Wt 90.266 kg (199 lb)  BMI 35.25 kg/m2  LMP 05/13/2010      FHT:  FHR: 135 bpm, variability: moderate,  accelerations:  Abscent,  decelerations:  Present earlys, previous tracing reviewed in past 30 min w/Reactive/Reassurring Cat. I no decels  UC:   regular, every 2-4 minutes SVE:   Dilation: 2.5 Effacement (%): 70 Station: -1 Exam by:: Samara Deist, RN  Labs: Lab Results  Component Value Date   WBC 10.0 02/19/2011   HGB 11.3* 02/19/2011   HCT 34.1* 02/19/2011   MCV 87.0 02/19/2011   PLT 284 02/19/2011    Assessment / Plan: Augmentation of labor, progressing well  Labor: Progressing normally Fetal Wellbeing:  Category I Pain Control:  Labor support without medications I/D:  n/a Anticipated MOD:  NSVD  [name] 02/19/2011, 2:03 PM

## 2011-02-19 NOTE — Progress Notes (Signed)
CHANTRELL APSEY is a 25 y.o. G2P0010 at [redacted]w[redacted]d by LMP admitted for PROM  Subjective: States UC's are more uncomfortable, but denies need for pain management at this time   Objective: BP 99/64  Pulse 94  Temp(Src) 98.8 F (37.1 C) (Oral)  Resp 18  Ht 5\' 3"  (1.6 m)  Wt 90.266 kg (199 lb)  BMI 35.25 kg/m2  LMP 05/13/2010      FHT:  FHR: 135 bpm, variability: moderate,  accelerations:  Present,  decelerations:  Absent UC:   regular, every 3-5 minutes SVE:   Dilation: 2.5 Effacement (%): 70 Station: -1 Exam by:: Smera Guyette cnm  Labs: Lab Results  Component Value Date   WBC 10.0 02/19/2011   HGB 11.3* 02/19/2011   HCT 34.1* 02/19/2011   MCV 87.0 02/19/2011   PLT 284 02/19/2011    Assessment / Plan: Augmentation of labor, progressing well  Labor: Progressing normally and will now start Pitocin augmentation since cervix more favorable Preeclampsia:  no signs or symptoms of toxicity Fetal Wellbeing:  Category I Pain Control:  Labor support without medications I/D:  n/a Anticipated MOD:  NSVD  [name] 02/19/2011, 11:37 AM

## 2011-02-19 NOTE — Progress Notes (Signed)
Beverly Scott is a 25 y.o. G2P0010 at [redacted]w[redacted]d by LMP admitted for rupture of membranes  Subjective:   Objective: BP 115/76  Pulse 104  Temp(Src) 98.5 F (36.9 C) (Oral)  Resp 18  Ht 5\' 3"  (1.6 m)  Wt 90.266 kg (199 lb)  BMI 35.25 kg/m2  LMP 05/13/2010      FHT:  FHR: 140 bpm, variability: moderate,  accelerations:  Present,  decelerations:  Present Few lates, resolved w/ position change. UC:   regular, every 2-3 minutes SVE:   Dilation: 8 Effacement (%): 90 Station: 0 Exam by:: V, Rmani Kapusta, CNM  AROMed forebag, mod amount of clear fluid.  Labs: Lab Results  Component Value Date   WBC 10.0 02/19/2011   HGB 11.3* 02/19/2011   HCT 34.1* 02/19/2011   MCV 87.0 02/19/2011   PLT 284 02/19/2011    Assessment / Plan: Induction of labor due to PROM,  progressing well on pitocin.  Labor: Progressing normally Preeclampsia:  none Fetal Wellbeing:  Category I Pain Control:  Epidural I/D:  n/a Anticipated MOD:  NSVD  Velma Hanna 02/19/2011, 8:28 PM

## 2011-02-19 NOTE — Progress Notes (Signed)
   Subjective:  S/P Epidural, comfortable Denies pressure or urge to push Pt states last cervical exam approx 20 min ago and was 5 cms per RN   Objective: BP 112/69  Pulse 94  Temp(Src) 97 F (36.1 C) (Oral)  Resp 18  Ht 5\' 3"  (1.6 m)  Wt 90.266 kg (199 lb)  BMI 35.25 kg/m2  LMP 05/13/2010      FHT:  FHR: 135 bpm, variability: moderate,  accelerations:  Present,  decelerations:  Absent UC:   regular, every 2 minutes SVE:   Dilation: 3 Effacement (%): 80 Station: -2 Exam by:: cwicker,rnc  Labs: Lab Results  Component Value Date   WBC 10.0 02/19/2011   HGB 11.3* 02/19/2011   HCT 34.1* 02/19/2011   MCV 87.0 02/19/2011   PLT 284 02/19/2011    Assessment / Plan: Augmentation of labor, progressing well  Labor: Progressing normally Fetal Wellbeing:  Category I Pain Control:  Epidural I/D:  n/a Anticipated MOD:  NSVD Dr. Su Hilt aware of pt status with phone update  Beverly Scott 02/19/2011, 4:57 PM

## 2011-02-19 NOTE — Progress Notes (Signed)
Beverly Scott is a 25 y.o. G2P0010 at 103w1d by LMP admitted for induction of labor due to Spontaneous rupture of BOW., PROM  Subjective:  Breathing through UC's and desires Epidural. Had one dose of IV pain med and states "it didn't help very much"   Objective: BP 114/67  Pulse 101  Temp(Src) 98.8 F (37.1 C) (Oral)  Resp 20  Ht 5\' 3"  (1.6 m)  Wt 90.266 kg (199 lb)  BMI 35.25 kg/m2  LMP 05/13/2010   SVE no change from previous exam by RN 3/80/-1    FHT:  FHR: 135 bpm, variability: moderate,  accelerations:  Present,  decelerations:  Absent UC:   regular, every 2-3 minutes SVE:   Dilation: 3 Effacement (%): 80 Station: -2 Exam by:: cwicker,rnc  Labs: Lab Results  Component Value Date   WBC 10.0 02/19/2011   HGB 11.3* 02/19/2011   HCT 34.1* 02/19/2011   MCV 87.0 02/19/2011   PLT 284 02/19/2011    Assessment / Plan: Augmentation of labor, progressing well  Labor: Progressing normally and on pitocin Preeclampsia:  no signs or symptoms of toxicity Fetal Wellbeing:  Category I Pain Control:  pending epidural I/D:  n/a P: Epidural, continue pitocin Anticipated MOD:  NSVD  [name] 02/19/2011, 3:39 PM

## 2011-02-19 NOTE — Anesthesia Procedure Notes (Addendum)
Epidural Patient location during procedure: OB Start time: 02/19/2011 3:31 PM  Preanesthetic Checklist Completed: patient identified, site marked, surgical consent, pre-op evaluation, timeout performed, IV checked, risks and benefits discussed and monitors and equipment checked  Epidural Patient position: sitting Prep: DuraPrep Patient monitoring: continuous pulse ox and blood pressure Approach: midline Injection technique: LOR air  Needle Needle type: Tuohy  Needle gauge: 17 G Needle length: 9 cm Catheter type: closed end flexible Catheter size: 19 Gauge Test dose: negative  Assessment Events: blood not aspirated, injection not painful, no injection resistance, negative IV test and no paresthesia Discussed epidural, and patient consents to the procedure:  included risk of possible headache,backache, failed block, allergic reaction, and nerve injury. This patient was asked if she had any questions or concerns before the procedure started. See nursing notes for post-procedure vital signs.  Patient feels better.

## 2011-02-19 NOTE — Progress Notes (Signed)
PT DENIES HSV AND MRSA. SAYS SROM AT [facility].  VE ON Sunday 1-2 CM

## 2011-02-20 ENCOUNTER — Encounter (HOSPITAL_COMMUNITY): Payer: Self-pay | Admitting: *Deleted

## 2011-02-20 ENCOUNTER — Other Ambulatory Visit: Payer: Self-pay | Admitting: Advanced Practice Midwife

## 2011-02-20 DIAGNOSIS — D649 Anemia, unspecified: Secondary | ICD-10-CM | POA: Diagnosis present

## 2011-02-20 DIAGNOSIS — O358XX0 Maternal care for other (suspected) fetal abnormality and damage, not applicable or unspecified: Secondary | ICD-10-CM | POA: Diagnosis present

## 2011-02-20 LAB — CBC
HCT: 32.6 % — ABNORMAL LOW (ref 36.0–46.0)
MCH: 28.9 pg (ref 26.0–34.0)
Platelets: 274 10*3/uL (ref 150–400)
Platelets: 276 10*3/uL (ref 150–400)
RBC: 3.71 MIL/uL — ABNORMAL LOW (ref 3.87–5.11)
RBC: 3.57 MIL/uL — ABNORMAL LOW (ref 3.87–5.11)
RDW: 14.9 % (ref 11.5–15.5)
RDW: 15 % (ref 11.5–15.5)
WBC: 18.2 10*3/uL — ABNORMAL HIGH (ref 4.0–10.5)
WBC: 17.9 10*3/uL — ABNORMAL HIGH (ref 4.0–10.5)

## 2011-02-20 MED ORDER — ZOLPIDEM TARTRATE 5 MG PO TABS: 5.0000 mg | ORAL_TABLET | Freq: Every evening | ORAL | Status: DC | PRN

## 2011-02-20 MED ORDER — MISOPROSTOL 200 MCG PO TABS
ORAL_TABLET | ORAL | Status: AC
  Administered 2011-02-20: 1000 ug via RECTAL
  Filled 2011-02-20: qty 5

## 2011-02-20 MED ORDER — SODIUM CHLORIDE 0.9 % IV SOLN: 3.0000 g | Freq: Four times a day (QID) | INTRAVENOUS | Status: DC | PRN

## 2011-02-20 MED ORDER — WITCH HAZEL-GLYCERIN EX PADS: MEDICATED_PAD | CUTANEOUS | Status: DC | PRN

## 2011-02-20 MED ORDER — BENZOCAINE-MENTHOL 20-0.5 % EX AERO: 1.0000 "application " | INHALATION_SPRAY | CUTANEOUS | Status: DC | PRN

## 2011-02-20 MED ORDER — MEDROXYPROGESTERONE ACETATE 150 MG/ML IM SUSP: 150.0000 mg | INTRAMUSCULAR | Status: DC | PRN

## 2011-02-20 MED ORDER — OXYCODONE-ACETAMINOPHEN 5-325 MG PO TABS
1.0000 | ORAL_TABLET | ORAL | Status: DC | PRN
  Administered 2011-02-20 (×3): 1 via ORAL
  Administered 2011-02-20 – 2011-02-21 (×2): 2 via ORAL
  Administered 2011-02-21 – 2011-02-22 (×2): 1 via ORAL
  Filled 2011-02-20 (×3): qty 1
  Filled 2011-02-20: qty 2
  Filled 2011-02-20: qty 1
  Filled 2011-02-20: qty 2
  Filled 2011-02-20: qty 1

## 2011-02-20 MED ORDER — ONDANSETRON HCL 4 MG PO TABS: 4.0000 mg | ORAL_TABLET | ORAL | Status: DC | PRN

## 2011-02-20 MED ORDER — RHO D IMMUNE GLOBULIN 1500 UNIT/2ML IJ SOLN: 300.0000 ug | Freq: Once | INTRAMUSCULAR | Status: DC

## 2011-02-20 MED ORDER — PRENATAL PLUS 27-1 MG PO TABS
1.0000 | ORAL_TABLET | Freq: Every day | ORAL | Status: DC
  Administered 2011-02-20 – 2011-02-22 (×3): 1 via ORAL
  Filled 2011-02-20 (×3): qty 1

## 2011-02-20 MED ORDER — MISOPROSTOL 200 MCG PO TABS
1000.0000 ug | ORAL_TABLET | Freq: Once | ORAL | Status: AC
  Administered 2011-02-20: 1000 ug via RECTAL

## 2011-02-20 MED ORDER — METHYLERGONOVINE MALEATE 0.2 MG PO TABS: 0.2000 mg | ORAL_TABLET | ORAL | Status: DC | PRN

## 2011-02-20 MED ORDER — FERROUS SULFATE 325 (65 FE) MG PO TABS
325.0000 mg | ORAL_TABLET | Freq: Two times a day (BID) | ORAL | Status: DC
  Administered 2011-02-20 – 2011-02-22 (×4): 325 mg via ORAL
  Filled 2011-02-20 (×4): qty 1

## 2011-02-20 MED ORDER — LANOLIN HYDROUS EX OINT: 1.0000 "application " | TOPICAL_OINTMENT | CUTANEOUS | Status: DC | PRN

## 2011-02-20 MED ORDER — TETANUS-DIPHTH-ACELL PERTUSSIS 5-2.5-18.5 LF-MCG/0.5 IM SUSP
0.5000 mL | Freq: Once | INTRAMUSCULAR | Status: DC
  Filled 2011-02-20: qty 0.5

## 2011-02-20 MED ORDER — MISOPROSTOL 200 MCG PO TABS: 1000.0000 ug | ORAL_TABLET | Freq: Once | ORAL | Status: DC

## 2011-02-20 MED ORDER — SIMETHICONE 80 MG PO CHEW: 80.0000 mg | CHEWABLE_TABLET | ORAL | Status: DC | PRN

## 2011-02-20 MED ORDER — IBUPROFEN 600 MG PO TABS
600.0000 mg | ORAL_TABLET | Freq: Four times a day (QID) | ORAL | Status: DC
  Administered 2011-02-20 – 2011-02-22 (×9): 600 mg via ORAL
  Filled 2011-02-20 (×5): qty 1

## 2011-02-20 MED ORDER — SENNOSIDES-DOCUSATE SODIUM 8.6-50 MG PO TABS
1.0000 | ORAL_TABLET | Freq: Every day | ORAL | Status: DC
  Administered 2011-02-20 – 2011-02-21 (×2): 1 via ORAL

## 2011-02-20 MED ORDER — BISACODYL 10 MG RE SUPP: 10.0000 mg | Freq: Every day | RECTAL | Status: DC | PRN

## 2011-02-20 MED ORDER — BENZOCAINE-MENTHOL 20-0.5 % EX AERO
INHALATION_SPRAY | CUTANEOUS | Status: AC
  Filled 2011-02-20: qty 56

## 2011-02-20 MED ORDER — ONDANSETRON HCL 4 MG/2ML IJ SOLN: 4.0000 mg | INTRAMUSCULAR | Status: DC | PRN

## 2011-02-20 MED ORDER — DIPHENHYDRAMINE HCL 25 MG PO CAPS: 25.0000 mg | ORAL_CAPSULE | Freq: Four times a day (QID) | ORAL | Status: DC | PRN

## 2011-02-20 MED ORDER — FLEET ENEMA 7-19 GM/118ML RE ENEM: 1.0000 | ENEMA | RECTAL | Status: DC | PRN

## 2011-02-20 NOTE — Progress Notes (Signed)
Beverly Scott is a 25 y.o. G2P1011 at [redacted]w[redacted]d by LMP admitted for induction of labor due to Spontaneous rupture of BOW.  Subjective: Pt reports urge to push  Objective: BP 123/86  Pulse 150  Temp(Src) 98.2 F (36.8 C) (Oral)  Resp 18  Ht 5\' 3"  (1.6 m)  Wt 90.266 kg (199 lb)  BMI 35.25 kg/m2  LMP 05/13/2010  Breastfeeding? Unknown   I/O this shift: In: -  Out: 300 [Urine:300]  FHT:  FHR: 150 bpm, variability: moderate,  accelerations:  Present,  decelerations:  Present few late decels, resolved UC:   regular, every 2-3 minutes SVE:   Dilation: 10 Effacement (%): 100 Station: +2 Exam by:: V.Briannie Gutierrez, CNM  Labs: Lab Results  Component Value Date   WBC 18.2* 02/20/2011   HGB 10.7* 02/20/2011   HCT 32.6* 02/20/2011   MCV 87.9 02/20/2011   PLT 274 02/20/2011    Assessment / Plan: Induction of labor due to PROM,  progressing well on pitocin  Labor: Progressing normally Preeclampsia:  N/A Fetal Wellbeing:  Category I Pain Control:  Epidural I/D:  n/a Anticipated MOD:  NSVD Begin active pushing  Beverly Scott 02/20/2011, 2:26 AM

## 2011-02-20 NOTE — Progress Notes (Signed)
  Post Partum Day 0 Subjective: no complaints, up ad lib, voiding, tolerating PO and + flatus  Objective: Blood pressure 96/64, pulse 91, temperature 98.1 F (36.7 C), temperature source Oral, resp. rate 18, height 5\' 3"  (1.6 m), weight 90.266 kg (199 lb), last menstrual period 05/13/2010, SpO2 97.00%, unknown if currently breastfeeding.  Physical Exam:  General: alert Lochia: appropriate Uterine Fundus: firm DVT Evaluation: No evidence of DVT seen on physical exam.   Basename 02/20/11 0525 02/20/11 0200  HGB 10.3* 10.7*  HCT 31.0* 32.6*    Assessment/Plan: Plan for discharge tomorrow, Breastfeeding, Lactation consult, Circumcision prior to discharge and Contraception Mirena IUD. Improved temp.  Will Obs sonly for now.  Antibiotics if temp greater than 100.4.   LOS: 1 day   Jannae Fagerstrom V 02/20/2011, 10:59 AM

## 2011-02-20 NOTE — Progress Notes (Signed)
BREASTFEEDING CONSULTATION SERVICES INFORMATION GIVEN TO PT.  BASIC ASSIST AND TEACHING DONE.  SHELLS GIVEN WITH INSTRUCTIONS.  ENCOURAGED TO CALL FOR CONCERNS OR ASSIST PRN.

## 2011-02-20 NOTE — Plan of Care (Signed)
Problem: Phase I Progression Outcomes Goal: Complete instrument count Outcome: Completed/Met Date Met:  02/20/11 10 Goal: Count of instrument items (Specify in a note) Outcome: Completed/Met Date Met:  02/20/11 10

## 2011-02-21 LAB — CBC
HCT: 31 % — ABNORMAL LOW (ref 36.0–46.0)
Hemoglobin: 10.2 g/dL — ABNORMAL LOW (ref 12.0–15.0)
RDW: 14.8 % (ref 11.5–15.5)
WBC: 12.6 10*3/uL — ABNORMAL HIGH (ref 4.0–10.5)

## 2011-02-21 NOTE — Progress Notes (Signed)
Mom tearful; had given baby 35 ccs of formula b/c baby seemed constantly hungry and she wasn't sure that baby was getting enough.  Suck exam done on baby; baby w/excellent tongue ROM and suck.  Baby cueing to go to breast.  Baby put to breast w/very little difficulty.  Multiple, frequent swallows heard.  Mom also listened to swallows w/baby stethoscope.  Mom pleased. Educated Mom on cluster feeding; normal newborn behavior; breast compression; and volume expected per feeding.  Mom to note nipple shape after feeding. Olivia Mackie, Octave Montrose [place].

## 2011-02-21 NOTE — Progress Notes (Signed)
Post Partum Day 1 Subjective: no complaints  Objective: Blood pressure 93/60, pulse 83, temperature 97.6 F (36.4 C), temperature source Oral, resp. rate 18, height 5\' 3"  (1.6 m), weight 90.266 kg (199 lb), last menstrual period 05/13/2010, SpO2 98.00%, unknown if currently breastfeeding. Breastfeeding  Physical Exam:  General: alert Lochia: appropriate Uterine Fundus: firm Incision: healing well DVT Evaluation: No evidence of DVT seen on physical exam.   Basename 02/21/11 1050 02/20/11 0525  HGB 10.2* 10.3*  HCT 31.0* 31.0*    Assessment/Plan: Plan for discharge tomorrow   LOS: 2 days   Johathan Province L 02/21/2011, 11:44 AM

## 2011-02-22 MED ORDER — BENZOCAINE-MENTHOL 20-0.5 % EX AERO
INHALATION_SPRAY | CUTANEOUS | Status: AC
  Filled 2011-02-22: qty 56

## 2011-02-22 MED ORDER — IBUPROFEN 600 MG PO TABS: 600.0000 mg | ORAL_TABLET | Freq: Four times a day (QID) | ORAL | Status: DC | PRN

## 2011-02-22 MED ORDER — IBUPROFEN 600 MG PO TABS: 600.0000 mg | ORAL_TABLET | Freq: Four times a day (QID) | ORAL | Status: AC

## 2011-02-22 MED ORDER — OXYCODONE-ACETAMINOPHEN 5-325 MG PO TABS: 1.0000 | ORAL_TABLET | ORAL | Status: AC | PRN

## 2011-02-22 NOTE — Discharge Summary (Signed)
Obstetric Discharge Summary Reason for Admission: onset of labor Prenatal Procedures: none Intrapartum Procedures: spontaneous vaginal delivery Postpartum Procedures: none Complications-Operative and Postpartum: 2nd degree perineal laceration  Hemoglobin  Date Value Range Status  02/21/2011 10.2* 12.0-15.0 (g/dL) Final     HCT  Date Value Range Status  02/21/2011 31.0* 36.0-46.0 (%) Final    Discharge Diagnoses: Term Pregnancy-delivered  Discharge Information: Date: 02/22/2011 Activity: unrestricted Diet: routine Medications: Ibuprophen and Percocet Condition: stable Instructions: refer to practice specific booklet Discharge to: home Follow-up Information    [facility] up with [facility] [place] [facility]. Make an appointment in 6 weeks. (As needed)          Newborn Data: Live born  Information for the patient's newborn:  Daneen, Volcy [[id number]]  female ; APGAR 9/9 , ; weight 8+2 ;  Home with mother.  Shaylee Stanislawski L 02/22/2011, 8:44 AM

## 2011-02-22 NOTE — Progress Notes (Signed)
Post Partum Day 2 Subjective: no complaints, up ad lib and ready for discharge.  Objective: Blood pressure 96/65, pulse 84, temperature 98.4 F (36.9 C), temperature source Oral, resp. rate 18, height 5\' 3"  (1.6 m), weight 90.266 kg (199 lb), last menstrual period 05/13/2010, SpO2 98.00%, unknown if currently breastfeeding.  Physical Exam:  General: alert Lochia: appropriate Uterine Fundus: firm Incision: healing well DVT Evaluation: No evidence of DVT seen on physical exam.   Basename 02/21/11 1050 02/20/11 0525  HGB 10.2* 10.3*  HCT 31.0* 31.0*    Assessment/Plan: Discharge home Plans Mirena Rx Motrin and Percocet (using Percocet for perineal pain as adjunct to Motrin).   LOS: 3 days   Bartow Zylstra L 02/22/2011, 8:41 AM

## 2011-02-22 NOTE — Progress Notes (Signed)
Mom's left nipple noted to have a compression stripe w/slight crack at base of nipple.  No bleeding noted.  Mom given following d/c instructions: Use Aquaphor on nipples after feeding, may remove w/Cetaphil or generic equivalent before next feed.  Lead w/baby's chin when latching on.  Call if worsens.  Multiple, frequent swallowing heard. Beverly Scott, Beverly Scott.

## 2011-03-07 NOTE — Anesthesia Postprocedure Evaluation (Signed)
  Anesthesia Post-op Note  Patient: Beverly Scott  Procedure(s) Performed: * No procedures listed *  Patient stable following vaginal delivery.

## 2011-03-14 ENCOUNTER — Inpatient Hospital Stay (INDEPENDENT_AMBULATORY_CARE_PROVIDER_SITE_OTHER)
Admission: RE | Admit: 2011-03-14 | Discharge: 2011-03-14 | Disposition: A | Discharge status: Home / Self Care | Payer: BC Managed Care – PPO | Source: Ambulatory Visit | Attending: Family Medicine | Admitting: Family Medicine

## 2011-03-14 DIAGNOSIS — R229 Localized swelling, mass and lump, unspecified: Secondary | ICD-10-CM

## 2012-01-08 ENCOUNTER — Encounter (HOSPITAL_COMMUNITY): Payer: Self-pay | Admitting: Emergency Medicine

## 2012-01-08 ENCOUNTER — Emergency Department (HOSPITAL_COMMUNITY)
Admission: EM | Admit: 2012-01-08 | Discharge: 2012-01-08 | Disposition: A | Discharge status: Home / Self Care | Payer: No Typology Code available for payment source | Attending: Emergency Medicine | Admitting: Emergency Medicine

## 2012-01-08 DIAGNOSIS — IMO0001 Reserved for inherently not codable concepts without codable children: Secondary | ICD-10-CM | POA: Insufficient documentation

## 2012-01-08 DIAGNOSIS — M25519 Pain in unspecified shoulder: Secondary | ICD-10-CM | POA: Insufficient documentation

## 2012-01-08 DIAGNOSIS — M545 Low back pain, unspecified: Secondary | ICD-10-CM | POA: Insufficient documentation

## 2012-01-08 DIAGNOSIS — R42 Dizziness and giddiness: Secondary | ICD-10-CM | POA: Insufficient documentation

## 2012-01-08 DIAGNOSIS — K219 Gastro-esophageal reflux disease without esophagitis: Secondary | ICD-10-CM | POA: Insufficient documentation

## 2012-01-08 DIAGNOSIS — R112 Nausea with vomiting, unspecified: Secondary | ICD-10-CM | POA: Insufficient documentation

## 2012-01-08 DIAGNOSIS — J45909 Unspecified asthma, uncomplicated: Secondary | ICD-10-CM | POA: Insufficient documentation

## 2012-01-08 MED ORDER — IBUPROFEN 400 MG PO TABS
ORAL_TABLET | ORAL | Status: AC
  Filled 2012-01-08: qty 1

## 2012-01-08 MED ORDER — IBUPROFEN 200 MG PO TABS
600.0000 mg | ORAL_TABLET | Freq: Once | ORAL | Status: AC
  Administered 2012-01-08: 600 mg via ORAL

## 2012-01-08 MED ORDER — IBUPROFEN 200 MG PO TABS
ORAL_TABLET | ORAL | Status: AC
  Filled 2012-01-08: qty 1

## 2012-01-08 MED ORDER — DIAZEPAM 5 MG PO TABS: 5.0000 mg | ORAL_TABLET | Freq: Two times a day (BID) | ORAL | Status: AC

## 2012-01-08 MED ORDER — IBUPROFEN 800 MG PO TABS: 800.0000 mg | ORAL_TABLET | Freq: Three times a day (TID) | ORAL | Status: AC

## 2012-01-08 MED ORDER — ONDANSETRON 4 MG PO TBDP
4.0000 mg | ORAL_TABLET | Freq: Once | ORAL | Status: AC
Start: 2012-01-08 — End: 2012-01-08
  Administered 2012-01-08: 4 mg via ORAL
  Filled 2012-01-08: qty 1

## 2012-01-08 NOTE — ED Notes (Signed)
EMS stated that while arriving  to ED with child mother became nauseated and vomited x 2. Brought mother to ED to be seen.
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
## 2012-01-09 NOTE — ED Provider Notes (Signed)
Medical screening examination/treatment/procedure(s) were performed by non-physician practitioner and as supervising physician I was immediately available for consultation/collaboration.  Flint Melter, MD 01/09/12 [phone]

## 2012-04-30 ENCOUNTER — Encounter: Payer: Self-pay | Admitting: Obstetrics and Gynecology

## 2012-04-30 ENCOUNTER — Ambulatory Visit (INDEPENDENT_AMBULATORY_CARE_PROVIDER_SITE_OTHER): Payer: Managed Care, Other (non HMO) | Admitting: Obstetrics and Gynecology

## 2012-04-30 VITALS — BP 102/62 | Ht 64.0 in | Wt 170.0 lb

## 2012-04-30 DIAGNOSIS — N898 Other specified noninflammatory disorders of vagina: Secondary | ICD-10-CM

## 2012-04-30 LAB — POCT WET PREP (WET MOUNT): WBC, Wet Prep HPF POC: NEGATIVE

## 2012-04-30 MED ORDER — METRONIDAZOLE 500 MG PO TABS: 500.0000 mg | ORAL_TABLET | Freq: Two times a day (BID) | ORAL | Status: DC

## 2012-04-30 NOTE — Progress Notes (Signed)
HISTORY OF PRESENT ILLNESS  Ms. Beverly Scott is a 26 y.o. year old female,G2P1011, who presents for a problem visit. The patient has a Mirena IUD and continues to have her period each month.  A recent x-ray showed the IUD in place.  Subjective:  The patient complains of a vaginal discharge with an odor.  Objective:  BP 102/62  Ht 5\' 4"  (1.626 m)  Wt 170 lb (77.111 kg)  BMI 29.18 kg/m2  LMP 04/15/2012   General: no distress GI: soft and nontender  External genitalia: normal general appearance Vaginal: relaxation noted Adnexa: normal bimanual exam Uterus: normal size shape and consistency  Wet prep: PH 5.5.  Positive clue cells.  No trichomoniasis. Whiff positive.  Assessment:  Bacterial vaginosis  Plan:  Metronidazole 500 mg twice each day for 7 days. Gonorrhea and Chlamydia sent.  Return to office in 2 month(s) for annual exam.   Leonard Schwartz M.D.  04/30/2012 2:37 PM

## 2012-05-01 LAB — GC/CHLAMYDIA PROBE AMP, GENITAL
Chlamydia, DNA Probe: NEGATIVE
GC Probe Amp, Genital: NEGATIVE

## 2012-05-05 ENCOUNTER — Telehealth: Payer: Self-pay | Admitting: Obstetrics and Gynecology

## 2012-05-05 ENCOUNTER — Encounter: Payer: Self-pay | Admitting: Obstetrics and Gynecology

## 2012-05-06 MED ORDER — METRONIDAZOLE 0.75 % VA GEL: VAGINAL | Status: DC

## 2012-05-06 NOTE — Telephone Encounter (Signed)
Tc to pt per telephone call. Told pt GC/CT=both negative. Pt states,"completed approx 4 or 5 days of Flagyl po;however could not finish tx due to stomach upset". Pt wants to have Metrogel called to pharm instead. Pt still c/o same amount of d/c and mild fishy odor. Will consult with avs per recs. Pt agrees.

## 2012-05-06 NOTE — Telephone Encounter (Signed)
Consulted with AVS, pt may have Metrogel. Rx for Metrogel e-pres to pharm on file. Pt agrees.

## 2012-06-02 ENCOUNTER — Ambulatory Visit: Payer: Self-pay | Admitting: Obstetrics and Gynecology

## 2012-06-04 ENCOUNTER — Ambulatory Visit: Payer: Managed Care, Other (non HMO) | Admitting: Obstetrics and Gynecology

## 2012-06-05 ENCOUNTER — Encounter: Payer: Managed Care, Other (non HMO) | Admitting: Obstetrics and Gynecology

## 2012-06-08 ENCOUNTER — Ambulatory Visit: Payer: Managed Care, Other (non HMO) | Admitting: Obstetrics and Gynecology

## 2012-06-16 ENCOUNTER — Ambulatory Visit: Payer: Managed Care, Other (non HMO) | Admitting: Obstetrics and Gynecology

## 2012-07-02 ENCOUNTER — Ambulatory Visit (INDEPENDENT_AMBULATORY_CARE_PROVIDER_SITE_OTHER): Payer: Managed Care, Other (non HMO) | Admitting: Obstetrics and Gynecology

## 2012-07-02 ENCOUNTER — Encounter: Payer: Self-pay | Admitting: Obstetrics and Gynecology

## 2012-07-02 VITALS — BP 110/70 | HR 72 | Temp 97.9°F | Ht 64.0 in | Wt 169.0 lb

## 2012-07-02 DIAGNOSIS — Z124 Encounter for screening for malignant neoplasm of cervix: Secondary | ICD-10-CM

## 2012-07-02 DIAGNOSIS — R102 Pelvic and perineal pain: Secondary | ICD-10-CM

## 2012-07-02 DIAGNOSIS — Z01419 Encounter for gynecological examination (general) (routine) without abnormal findings: Secondary | ICD-10-CM

## 2012-07-02 DIAGNOSIS — Z30431 Encounter for routine checking of intrauterine contraceptive device: Secondary | ICD-10-CM

## 2012-07-02 DIAGNOSIS — N949 Unspecified condition associated with female genital organs and menstrual cycle: Secondary | ICD-10-CM

## 2012-07-02 DIAGNOSIS — Z975 Presence of (intrauterine) contraceptive device: Secondary | ICD-10-CM

## 2012-07-02 DIAGNOSIS — N926 Irregular menstruation, unspecified: Secondary | ICD-10-CM

## 2012-07-02 LAB — POCT URINALYSIS DIPSTICK
Glucose, UA: NEGATIVE
Protein, UA: NEGATIVE
Spec Grav, UA: 1.01

## 2012-07-02 NOTE — Progress Notes (Signed)
Subjective:    Beverly Scott is a 26 y.o. female, G2P1011, who presents for an annual exam. The patient reports intermittent pain, sharp/crampy for the past month.   Sometimes dull and at other times can hardly walk.  Unable to associate it with any activity and is rated at a 10/10 on a 10 point pain scale-relieved to 8/10 with Ibuprofen 600 mg.  Denies N/V/D, fever, dysuria (thought pain increases with voiding) , changes in bowel movements, or vaginitis symptoms.  Menstrual cycle:   LMP: Patient's last menstrual period was 06/16/2012.  Mirena inserted 04/12/11  Flow-(past 6 months) 10 days with pad change 5 times a day x 7 days then spotting, accompained by cramps 3/10 on a 10 point pain scale             Review of Systems Pertinent items are noted in HPI. Denies pelvic pain, urinary tract symptoms, vaginitis symptoms, irregular bleeding, menopausal symptoms, change in bowel habits or rectal bleeding   Objective:    Ht 5\' 4"  (1.626 m)  Wt 169 lb (76.658 kg)  BMI 29.01 kg/m2  LMP 06/16/2012    Wt Readings from Last 1 Encounters:  07/02/12 169 lb (76.658 kg)   Body mass index is 29.01 kg/(m^2). General Appearance: Alert, no acute distress HEENT: Grossly normal Neck / Thyroid: Supple, no thyromegaly or cervical adenopathy Lungs: Clear to auscultation bilaterally Back: No CVA tenderness Breast Exam: No masses or nodes.No dimpling, nipple retraction or discharge. Cardiovascular: Regular rate and rhythm.  Gastrointestinal: Soft, non-tender, no masses or organomegaly Pelvic Exam: EGBUS-wnl, vagina-normal rugae, cervix- without lesions or tenderness, no visible strings, uterus appears normal size shape and consistency but is tender,  adnexae-no masses  with right sided tenderness, no left sided tenderness Lymphatic Exam: Non-palpable nodes in neck, clavicular,  axillary, or inguinal regions  Skin: no rashes or abnormalities Extremities: no clubbing cyanosis or edema  Neurologic: grossly  normal Psychiatric: Alert and oriented  Urinalysis: ph-5.0;   SG- 1.010,  trace-leukocytes, 1+ ketones UPT: negative     Assessment:   Routine GYN Exam Irregular Bleeding with Mirena Missing IUD String Pelvic Pain Plan:  Pelvic U/S for IUD placement and to rule out pelvic masses  Ultrasound: uterus-6.91 x 5.29 x 4.17 cm with endometrium-0.188 cm, IUD within endometrial cavity properly  per 3 D rendering, normal appearing ovaries  Reviewed causes of pelvic pain: urogenital, previous surgery, gastrointestinal and musculoskeletal.   STD testing  PAP sent  RTO 1 year or prn  Itzamar Traynor,ELMIRAPA-C

## 2012-07-02 NOTE — Progress Notes (Signed)
Regular Periods: no Mammogram: no  Monthly Breast Ex.: yes Exercise: yes  Tetanus < 10 years: yes Seatbelts: yes  NI. Bladder Functn.: yes Abuse at home: no  Daily BM's: no Stressful Work: YES  Healthy Diet: yes Sigmoid-Colonoscopy: NO  Calcium: no Medical problems this year: IRREGULAR CYCLE   LAST WUJ:WJXB 1 YEAR  Contraception: IUD MIRENA  Mammogram:  NO  PCP: DR. Cameron Sprang  PMH: NO CHANGE  FMH: NO CHANGE  Last Bone Scan: NO  PT IS SINGLE.

## 2012-07-03 ENCOUNTER — Ambulatory Visit (INDEPENDENT_AMBULATORY_CARE_PROVIDER_SITE_OTHER): Payer: Managed Care, Other (non HMO) | Admitting: Obstetrics and Gynecology

## 2012-07-03 ENCOUNTER — Ambulatory Visit (INDEPENDENT_AMBULATORY_CARE_PROVIDER_SITE_OTHER): Payer: Managed Care, Other (non HMO)

## 2012-07-03 ENCOUNTER — Encounter: Payer: Self-pay | Admitting: Obstetrics and Gynecology

## 2012-07-03 VITALS — BP 104/70 | HR 72

## 2012-07-03 DIAGNOSIS — N949 Unspecified condition associated with female genital organs and menstrual cycle: Secondary | ICD-10-CM

## 2012-07-03 DIAGNOSIS — R102 Pelvic and perineal pain: Secondary | ICD-10-CM

## 2012-07-03 DIAGNOSIS — Z30431 Encounter for routine checking of intrauterine contraceptive device: Secondary | ICD-10-CM

## 2012-07-03 LAB — US OB TRANSVAGINAL

## 2012-07-03 LAB — TSH: TSH: 2.131 u[IU]/mL (ref 0.350–4.500)

## 2012-07-03 LAB — PAP IG W/ RFLX HPV ASCU

## 2012-07-03 MED ORDER — ESTRADIOL 2 MG PO TABS: 2.0000 mg | ORAL_TABLET | Freq: Every day | ORAL | Status: DC

## 2012-07-03 NOTE — Progress Notes (Signed)
26 YO seen yesterday for pelvic pain, irregular bleeding and missing IUD string returns for follow-up.   O: Ultrasound: uterus-6.91 x 5.29 x 4.17 cm, endometrium 0.188 cm      IUD in correct position within uterine cavity per 3D imaging; ovaries appear normal with a dominant follicle on right-2 cm, no free fluid  A: Irregular Bleeding ? Atrophic Endometrium     Pelvic Pain      IUD in place  P: Estradiol 2 mg #14 1 po qd x 14 days no refills       Reviewed causes of pelvic pain: urogenital, previous surgery, gastrointestinal and musculoskeletal.       Declined pain medications      Follow up with PCP for pelvic discomfort       Natanael Saladin, PA-C

## 2012-08-24 ENCOUNTER — Ambulatory Visit (INDEPENDENT_AMBULATORY_CARE_PROVIDER_SITE_OTHER): Payer: Managed Care, Other (non HMO) | Admitting: Obstetrics and Gynecology

## 2012-08-24 ENCOUNTER — Encounter: Payer: Self-pay | Admitting: Obstetrics and Gynecology

## 2012-08-24 ENCOUNTER — Telehealth: Payer: Self-pay | Admitting: Obstetrics and Gynecology

## 2012-08-24 VITALS — BP 102/60 | HR 68 | Wt 165.0 lb

## 2012-08-24 DIAGNOSIS — IMO0001 Reserved for inherently not codable concepts without codable children: Secondary | ICD-10-CM

## 2012-08-24 DIAGNOSIS — Z30432 Encounter for removal of intrauterine contraceptive device: Secondary | ICD-10-CM

## 2012-08-24 DIAGNOSIS — Z309 Encounter for contraceptive management, unspecified: Secondary | ICD-10-CM

## 2012-08-24 MED ORDER — NORGESTIMATE-ETH ESTRADIOL 0.25-35 MG-MCG PO TABS: 1.0000 | ORAL_TABLET | Freq: Every day | ORAL | Status: AC

## 2012-08-24 NOTE — Progress Notes (Signed)
27 YO with irregular bleeding with Mirena IUD x 1.5 years requests removal and another contraceptive.  Given Contraceptive Options handout and reviewed the same.  O: Pelvic: EGBUS-wnl, vagina-moderate blood, cervix-no lesions and IUD removed without difficulty, uterus/adnexae-normal without tenderness  UPT-negative  A: IUD Removal     Need for Contraception  P:  Ortho Cyclen  #1 1 po qd 11 refills       Reviewed BCPs:  MOA, dosing, drug interactions, need for BUM, side effects, benefits and risks to include VTE events       BCP Instruction Sheet       RTO- 3 months BCP follow up  Nubia Ziesmer, PA-C

## 2012-08-24 NOTE — Telephone Encounter (Signed)
Lm on vm to cb per telephone call.  

## 2012-08-25 ENCOUNTER — Encounter: Payer: Managed Care, Other (non HMO) | Admitting: Obstetrics and Gynecology

## 2012-09-08 NOTE — Telephone Encounter (Signed)
No response. Chart to fb.

## 2013-06-22 ENCOUNTER — Encounter (HOSPITAL_COMMUNITY): Payer: Self-pay | Admitting: Emergency Medicine

## 2013-06-22 ENCOUNTER — Emergency Department (HOSPITAL_COMMUNITY): Payer: Managed Care, Other (non HMO)

## 2013-06-22 ENCOUNTER — Emergency Department (HOSPITAL_COMMUNITY)
Admission: EM | Admit: 2013-06-22 | Discharge: 2013-06-22 | Disposition: A | Discharge status: Home / Self Care | Payer: Managed Care, Other (non HMO) | Attending: Emergency Medicine | Admitting: Emergency Medicine

## 2013-06-22 DIAGNOSIS — Z8719 Personal history of other diseases of the digestive system: Secondary | ICD-10-CM | POA: Insufficient documentation

## 2013-06-22 DIAGNOSIS — R42 Dizziness and giddiness: Secondary | ICD-10-CM | POA: Insufficient documentation

## 2013-06-22 DIAGNOSIS — Z872 Personal history of diseases of the skin and subcutaneous tissue: Secondary | ICD-10-CM | POA: Insufficient documentation

## 2013-06-22 DIAGNOSIS — J45909 Unspecified asthma, uncomplicated: Secondary | ICD-10-CM | POA: Insufficient documentation

## 2013-06-22 DIAGNOSIS — R110 Nausea: Secondary | ICD-10-CM

## 2013-06-22 DIAGNOSIS — M542 Cervicalgia: Secondary | ICD-10-CM | POA: Insufficient documentation

## 2013-06-22 DIAGNOSIS — Z91040 Latex allergy status: Secondary | ICD-10-CM | POA: Insufficient documentation

## 2013-06-22 DIAGNOSIS — R112 Nausea with vomiting, unspecified: Secondary | ICD-10-CM | POA: Insufficient documentation

## 2013-06-22 DIAGNOSIS — Z3202 Encounter for pregnancy test, result negative: Secondary | ICD-10-CM | POA: Insufficient documentation

## 2013-06-22 DIAGNOSIS — Z79899 Other long term (current) drug therapy: Secondary | ICD-10-CM | POA: Insufficient documentation

## 2013-06-22 HISTORY — DX: Cluster headache syndrome, unspecified, not intractable: G44.009

## 2013-06-22 LAB — CBC WITH DIFFERENTIAL/PLATELET
Basophils Absolute: 0 10*3/uL (ref 0.0–0.1)
Basophils Relative: 0 % (ref 0–1)
Eosinophils Relative: 2 % (ref 0–5)
HCT: 35.8 % — ABNORMAL LOW (ref 36.0–46.0)
MCHC: 33.2 g/dL (ref 30.0–36.0)
MCV: 84 fL (ref 78.0–100.0)
Monocytes Absolute: 0.4 10*3/uL (ref 0.1–1.0)
RDW: 13.7 % (ref 11.5–15.5)

## 2013-06-22 LAB — URINALYSIS, ROUTINE W REFLEX MICROSCOPIC
Glucose, UA: NEGATIVE mg/dL
Ketones, ur: 15 mg/dL — AB
Nitrite: NEGATIVE
Protein, ur: NEGATIVE mg/dL

## 2013-06-22 LAB — BASIC METABOLIC PANEL
Calcium: 9.2 mg/dL (ref 8.4–10.5)
Creatinine, Ser: 0.67 mg/dL (ref 0.50–1.10)
GFR calc Af Amer: >90 mL/min (ref >90)

## 2013-06-22 LAB — URINE MICROSCOPIC-ADD ON

## 2013-06-22 MED ORDER — SODIUM CHLORIDE 0.9 % IV BOLUS (SEPSIS)
1000.0000 mL | Freq: Once | INTRAVENOUS | Status: AC
  Administered 2013-06-22: 1000 mL via INTRAVENOUS

## 2013-06-22 MED ORDER — KETOROLAC TROMETHAMINE 30 MG/ML IJ SOLN
30.0000 mg | Freq: Once | INTRAMUSCULAR | Status: AC
  Administered 2013-06-22: 30 mg via INTRAVENOUS
  Filled 2013-06-22: qty 1

## 2013-06-22 MED ORDER — ONDANSETRON HCL 4 MG/2ML IJ SOLN
4.0000 mg | Freq: Once | INTRAMUSCULAR | Status: AC
  Administered 2013-06-22: 4 mg via INTRAVENOUS
  Filled 2013-06-22: qty 2

## 2013-06-22 MED ORDER — PROMETHAZINE HCL 25 MG PO TABS: 25.0000 mg | ORAL_TABLET | Freq: Four times a day (QID) | ORAL | Status: AC | PRN

## 2013-06-22 MED ORDER — NAPROXEN 500 MG PO TABS: 500.0000 mg | ORAL_TABLET | Freq: Two times a day (BID) | ORAL | Status: AC

## 2013-06-22 MED ORDER — CYCLOBENZAPRINE HCL 10 MG PO TABS: 10.0000 mg | ORAL_TABLET | Freq: Two times a day (BID) | ORAL | Status: AC | PRN

## 2013-06-22 NOTE — ED Notes (Signed)
Patient reports that she began having left-sided neck pain, n/v, and dizziness that began at midnight. Patient reports a history of cluster headaches.

## 2013-06-22 NOTE — ED Notes (Signed)
Patient transported to X-ray 

## 2013-06-22 NOTE — ED Provider Notes (Signed)
Medical screening examination/treatment/procedure(s) were performed by non-physician practitioner and as supervising physician I was immediately available for consultation/collaboration.  Elam Ellis L Arshdeep Bolger, MD 06/22/13 1545 
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
## 2013-06-22 NOTE — ED Provider Notes (Signed)
CSN: [id number]     Arrival date & time 06/22/13  4034 History   First MD Initiated Contact with Patient 06/22/13 [phone]     Chief Complaint  Patient presents with  . Neck Pain  . Emesis  . Near Syncope   (Consider location/radiation/quality/duration/timing/severity/associated sxs/prior Treatment) HPI Comments: Patient is a 27 year old female with a past medical history of cluster headaches who presents with a 1 day history of nausea and vomiting. Symptoms started gradually and progressively worsened since the onset. She reports associated left side neck pain that is aching and severe and worse with movement. The patient reports a "spasm" sensation. Patient also reports associated lightheadedness. Patient did not try anything for symptoms .   Past Medical History  Diagnosis Date  . Asthma   . Eczema   . GERD (gastroesophageal reflux disease)    Past Surgical History  Procedure Laterality Date  . Tonsillectomy  1990  . Tonsillectomy     Family History  Problem Relation Age of Onset  . Coronary artery disease Mother     <60  . Diabetes Mother   . Hyperlipidemia Mother   . Hypertension Mother   . Heart failure Mother     Sudden Death   History  Substance Use Topics  . Smoking status: Never Smoker   . Smokeless tobacco: Never Used  . Alcohol Use: No   OB History   Grav Para Term Preterm Abortions TAB SAB Ect Mult Living   2 1 1  1 1    1      Review of Systems  Gastrointestinal: Positive for nausea and vomiting.  Musculoskeletal: Positive for neck pain.  Neurological: Positive for light-headedness.  All other systems reviewed and are negative.    Allergies  Fish-derived products; Eggs or egg-derived products; Fluzone; and Latex  Home Medications   Current Outpatient Rx  Name  Route  Sig  Dispense  Refill  . norgestimate-ethinyl estradiol (ORTHO-CYCLEN,SPRINTEC,PREVIFEM) 0.25-35 MG-MCG tablet   Oral   Take 1 tablet by mouth daily.   1 Package   11   .  triamcinolone (KENALOG) 0.5 % cream   Topical   Apply 1 application topically daily as needed. to affected area          BP 110/77  Pulse 103  Temp(Src) 99.1 F (37.3 C) (Oral)  Resp 16  Ht 5\' 4"  (1.626 m)  Wt 161 lb (73.029 kg)  BMI 27.62 kg/m2  SpO2 99% Physical Exam  Nursing note and vitals reviewed. Constitutional: She is oriented to person, place, and time. She appears well-developed and well-nourished. No distress.  HENT:  Head: Normocephalic and atraumatic.  Eyes: Conjunctivae and EOM are normal.  Neck: Normal range of motion.  Cardiovascular: Normal rate and regular rhythm.  Exam reveals no gallop and no friction rub.   No murmur heard. Pulmonary/Chest: Effort normal and breath sounds normal. She has no wheezes. She has no rales. She exhibits no tenderness.  Abdominal: Soft. She exhibits no distension. There is no tenderness. There is no rebound and no guarding.  Musculoskeletal: Normal range of motion.  No midline spine tenderness to palpation. Left paraspinal cervical and left trapezius tenderness to palpation.   Neurological: She is alert and oriented to person, place, and time. Coordination normal.  No meningeal signs. Speech is goal-oriented. Moves limbs without ataxia.   Skin: Skin is warm and dry.  Psychiatric: She has a normal mood and affect. Her behavior is normal.    ED Course  Procedures (including critical care time) Labs Review Labs Reviewed  CBC WITH DIFFERENTIAL - Abnormal; Notable for the following:    Hemoglobin 11.9 (*)    HCT 35.8 (*)    All other components within normal limits  URINALYSIS, ROUTINE W REFLEX MICROSCOPIC - Abnormal; Notable for the following:    APPearance CLOUDY (*)    Hgb urine dipstick MODERATE (*)    Ketones, ur 15 (*)    Leukocytes, UA TRACE (*)    All other components within normal limits  URINE MICROSCOPIC-ADD ON - Abnormal; Notable for the following:    Squamous Epithelial / LPF MANY (*)    All other components  within normal limits  URINE CULTURE  BASIC METABOLIC PANEL  PREGNANCY, URINE   Imaging Review Dg Cervical Spine Complete  06/22/2013   CLINICAL DATA:  History of injury 1 day previously. Left-sided neck pain. Tongue piercing jewelry not removed.  EXAM: CERVICAL SPINE  4+ VIEWS  COMPARISON:  None.  FINDINGS: There is no evidence of prevertebral soft tissue swelling. On the lateral image there is slight reversal of normal cervical lordosis. This may be associated with muscle spasm. Intervertebral disc spaces are maintained. No fracture, subluxation, bony destruction, or significant spondylosis is evident. Foramina appear patent. No cervical ribs are seen.  IMPRESSION: On the lateral image there is slight reversal of normal cervical lordosis. This may be associated with muscle spasm. No fracture, subluxation, or other cervical spine abnormality is identified   Electronically Signed   By: Onalee Hua  Call M.D.   On: 06/22/2013 09:25    EKG Interpretation   None       MDM   1. Neck pain on left side   2. Nausea     8:38 AM Labs and urinalysis pending. Vitals stable and patient afebrile. Patient will have fluids and zofran for symptoms.   12:23 PM Patient's labs, urinalysis and xray unremarkable for acute changes. Patient will be discharged with Naprsoyn, Flexeril, and phenergan. Vitals stable and patient afebrile. Patient instructed to return with worsening or concerning symptoms.     Emilia Beck, PA-C 06/22/13 1227

## 2013-06-22 NOTE — ED Notes (Addendum)
Pt did not stay for discharge papers.  This writer looked for pt in ED unable to find. Unable to assess pt status upon discharge at this time.

## 2013-06-23 LAB — URINE CULTURE

## 2014-06-13 ENCOUNTER — Encounter (HOSPITAL_COMMUNITY): Payer: Self-pay | Admitting: Emergency Medicine
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
## 2017-05-27 ENCOUNTER — Inpatient Hospital Stay (HOSPITAL_COMMUNITY)
Admission: AD | Admit: 2017-05-27 | Discharge: 2017-05-27 | Disposition: A | Discharge status: Home / Self Care | Payer: Managed Care, Other (non HMO) | Source: Ambulatory Visit | Attending: Family Medicine | Admitting: Family Medicine

## 2017-05-27 ENCOUNTER — Encounter (HOSPITAL_COMMUNITY): Payer: Self-pay

## 2017-05-27 DIAGNOSIS — N939 Abnormal uterine and vaginal bleeding, unspecified: Secondary | ICD-10-CM | POA: Insufficient documentation

## 2017-05-27 DIAGNOSIS — N970 Female infertility associated with anovulation: Secondary | ICD-10-CM | POA: Insufficient documentation

## 2017-05-27 LAB — URINALYSIS, ROUTINE W REFLEX MICROSCOPIC
BACTERIA UA: NONE SEEN
Bilirubin Urine: NEGATIVE
Glucose, UA: NEGATIVE mg/dL
Ketones, ur: NEGATIVE mg/dL
NITRITE: NEGATIVE
Protein, ur: NEGATIVE mg/dL
Specific Gravity, Urine: 1.014 (ref 1.005–1.030)
pH: 5 (ref 5.0–8.0)

## 2017-05-27 LAB — POCT PREGNANCY, URINE: PREG TEST UR: NEGATIVE

## 2017-05-27 MED ORDER — MEDROXYPROGESTERONE ACETATE 10 MG PO TABS: 10.0000 mg | ORAL_TABLET | Freq: Every day | ORAL | 2 refills | Status: DC

## 2017-05-27 MED ORDER — MEDROXYPROGESTERONE ACETATE 10 MG PO TABS: 10.0000 mg | ORAL_TABLET | Freq: Every day | ORAL | 2 refills | Status: AC

## 2017-05-27 NOTE — Discharge Instructions (Signed)
Dysfunctional Uterine Bleeding Dysfunctional uterine bleeding is abnormal bleeding from the uterus. Dysfunctional uterine bleeding includes:  A period that comes earlier or later than usual.  A period that is lighter, heavier, or has blood clots.  Bleeding between periods.  Skipping one or more periods.  Bleeding after sexual intercourse.  Bleeding after menopause.  Follow these instructions at home: Pay attention to any changes in your symptoms. Follow these instructions to help with your condition: Eating and drinking  Eat well-balanced meals. Include foods that are high in iron, such as liver, meat, shellfish, green leafy vegetables, and eggs.  If you become constipated: ? Drink plenty of water. ? Eat fruits and vegetables that are high in water and fiber, such as spinach, carrots, raspberries, apples, and mango. Medicines  Take over-the-counter and prescription medicines only as told by your health care provider.  Do not change medicines without talking with your health care provider.  Aspirin or medicines that contain aspirin may make the bleeding worse. Do not take those medicines: ? During the week before your period. ? During your period.  If you were prescribed iron pills, take them as told by your health care provider. Iron pills help to replace iron that your body loses because of this condition. Activity  If you need to change your sanitary pad or tampon more than one time every 2 hours: ? Lie in bed with your feet raised (elevated). ? Place a cold pack on your lower abdomen. ? Rest as much as possible until the bleeding stops or slows down.  Do not try to lose weight until the bleeding has stopped and your blood iron level is back to normal. Other Instructions  For two months, write down: ? When your period starts. ? When your period ends. ? When any abnormal bleeding occurs. ? What problems you notice.  Keep all follow up visits as told by your health  care provider. This is important. Contact a health care provider if:  You get light-headed or weak.  You have nausea and vomiting.  You cannot eat or drink without vomiting.  You feel dizzy or have diarrhea while you are taking medicines.  You are taking birth control pills or hormones, and you want to change them or stop taking them. Get help right away if:  You develop a fever or chills.  You need to change your sanitary pad or tampon more than one time per hour.  Your bleeding becomes heavier, or your flow contains clots more often.  You develop pain in your abdomen.  You lose consciousness.  You develop a rash. This information is not intended to replace advice given to you by your health care provider. Make sure you discuss any questions you have with your health care provider. Document Released: 07/26/2000 Document Revised: 01/04/2016 Document Reviewed: 10/24/2014 Elsevier Interactive Patient Education  2018 [employer]. Medroxyprogesterone tablets What is this medicine? MEDROXYPROGESTERONE (me DROX ee proe JES te rone) is a hormone in a class called progestins. It is commonly used to prevent the uterine lining from overgrowth in women taking an estrogen after menopause. It is also used to treat irregular menstrual bleeding or a lack of menstrual bleeding in women. This medicine may be used for other purposes; ask your health care provider or pharmacist if you have questions. COMMON BRAND NAME(S): Amen, Provera What should I tell my health care provider before I take this medicine? They need to know if you have any of these conditions: -blood vessel  disease or a history of a blood clot in the lungs or legs -breast, cervical or vaginal cancer -heart disease -kidney disease -liver disease -migraine -recent miscarriage or abortion -mental depression -migraine -seizures (convulsions) -stroke -vaginal bleeding that has not been evaluated -an unusual or allergic  reaction to medroxyprogesterone, other medicines, foods, dyes, or preservatives -pregnant or trying to get pregnant -breast-feeding How should I use this medicine? Take this medicine by mouth with a glass of water. Follow the directions on the prescription label. Take your doses at regular intervals. Do not take your medicine more often than directed. Talk to your pediatrician regarding the use of this medicine in children. Special care may be needed. While this drug may be prescribed for children as young as 13 years for selected conditions, precautions do apply. Overdosage: If you think you have taken too much of this medicine contact a poison control center or emergency room at once. NOTE: This medicine is only for you. Do not share this medicine with others. What if I miss a dose? If you miss a dose, take it as soon as you can. If it is almost time for your next dose, take only that dose. Do not take double or extra doses. What may interact with this medicine? -barbiturate medicines for inducing sleep or treating seizures (convulsions) -bosentan -carbamazepine -phenytoin -rifampin -St. John's Wort This list may not describe all possible interactions. Give your health care provider a list of all the medicines, herbs, non-prescription drugs, or dietary supplements you use. Also tell them if you smoke, drink alcohol, or use illegal drugs. Some items may interact with your medicine. What should I watch for while using this medicine? Visit your health care professional for regular checks on your progress. You will need a regular breast and pelvic exam. If you have any reason to think you are pregnant, stop taking this medicine at once and contact your doctor or health care professional. What side effects may I notice from receiving this medicine? Side effects that you should report to your doctor or health care professional as soon as possible: -breast tenderness or discharge -changes in mood  or emotions, such as depression -changes in vision or speech -pain in the abdomen, chest, groin, or leg -severe headache -skin rash, itching, or hives -sudden shortness of breath -unusually weak or tired -yellowing of skin or eyes Side effects that usually do not require medical attention (report to your doctor or health care professional if they continue or are bothersome): -acne -change in menstrual bleeding pattern or flow -changes in sexual desire -facial hair growth -fluid retention and swelling -headache -upset stomach -weight gain or loss This list may not describe all possible side effects. Call your doctor for medical advice about side effects. You may report side effects to FDA at 1-800-FDA-1088. Where should I keep my medicine? Keep out of the reach of children. Store at room temperature between 20 and 25 degrees C (68 and 77 degrees F). Throw away any unused medicine after the expiration date. NOTE: This sheet is a summary. It may not cover all possible information. If you have questions about this medicine, talk to your doctor, pharmacist, or health care provider.  2018 Elsevier/Gold Standard (2008-07-28 11:26:12)

## 2017-05-27 NOTE — MAU Note (Signed)
Abnormal bleeding for about a month. More cramping after her period recently.  Bleeding stops and starts and is not heavy.

## 2017-05-27 NOTE — MAU Note (Signed)
Has been bleeding for about a month.  Had normal cycle 9/13-9/17, then started bleeding on the 21st and it ahs continued. Has gotten light, but never completely stopped.  Went to Dr in [place] about a wk ago, was given Premarin, took all off it, continued to bleed.Marland Kitchen

## 2017-05-27 NOTE — MAU Provider Note (Signed)
Chief Complaint:  Vaginal Bleeding   First Provider Initiated Contact with Patient 05/27/17 1907       HPI: Beverly Scott is a 31 y.o. G2P1011 who presents to maternity admissions reporting persistent bleeding since 05/02/17.  Had normal periods up until now.  Period in Sept was normal.   Then 4 days later started bleeding again.  Has not been heavy, just persistent.  Treated with Premarin 0.125 q4 hrs which just finished.. She reports vaginal bleeding, no vaginal itching/burning, urinary symptoms, h/a, dizziness, n/v, or fever/chills.    Vaginal Bleeding  The patient's primary symptoms include vaginal bleeding. The patient's pertinent negatives include no genital itching, genital lesions, genital odor or pelvic pain. This is a recurrent problem. The current episode started 1 to 4 weeks ago. The problem occurs intermittently. The problem has been gradually improving. The patient is experiencing no pain. She is not pregnant. Pertinent negatives include no abdominal pain, back pain, chills, constipation, diarrhea, nausea or vomiting. The vaginal discharge was bloody. The vaginal bleeding is lighter than menses. She has not been passing clots. She has not been passing tissue. Nothing aggravates the symptoms.    RN Note: Has been bleeding for about a month.  Had normal cycle 9/13-9/17, then started bleeding on the 21st and it ahs continued. Has gotten light, but never completely stopped.  Went to Dr in [place] about a wk ago, was given Premarin, took all off it, continued to bleed..    Electronically signed by Spurlock-Frizzell, Job Founds, RN at 05/27/2017 7:00 PM     Past Medical History: Past Medical History:  Diagnosis Date  . Asthma   . Cluster headache   . Eczema   . GERD (gastroesophageal reflux disease)     Past obstetric history: OB History  Gravida Para Term Preterm AB Living  SAB TAB Ectopic Multiple Live Births    1     1    # Outcome Date GA Lbr Len/2nd  Weight Sex Delivery Anes PTL Lv  2 Term 02/20/11 [redacted]w[redacted]d -19:07 / 00:54 8 lb 2.1 oz (3.689 kg) M Vag-Spont EPI  LIV  1 TAB               Past Surgical History: Past Surgical History:  Procedure Laterality Date  . TONSILLECTOMY  1990  . TONSILLECTOMY      Family History: Family History  Problem Relation Age of Onset  . Coronary artery disease Mother        <60  . Diabetes Mother   . Hyperlipidemia Mother   . Hypertension Mother   . Heart failure Mother        Sudden Death    Social History: Social History  Substance Use Topics  . Smoking status: Never Smoker  . Smokeless tobacco: Never Used  . Alcohol use No    Allergies:  Allergies  Allergen Reactions  . Fish-Derived Products Hives and Shortness Of Breath  . Eggs Or Egg-Derived Products   . Fluzone Other (See Comments)    Reaction to fish products  . Latex Rash    Meds:  Prescriptions Prior to Admission  Medication Sig Dispense Refill Last Dose  . cyclobenzaprine (FLEXERIL) 10 MG tablet Take 1 tablet (10 mg total) by mouth 2 (two) times daily as needed for muscle spasms. 20 tablet 0   . ibuprofen (ADVIL,MOTRIN) 200 MG tablet Take 600 mg by mouth every 6 (six) hours as needed.   06/21/2013  at Unknown time  . naproxen (NAPROSYN) 500 MG tablet Take 1 tablet (500 mg total) by mouth 2 (two) times daily with a meal. 30 tablet 0   . norgestimate-ethinyl estradiol (ORTHO-CYCLEN,SPRINTEC,PREVIFEM) 0.25-35 MG-MCG tablet Take 1 tablet by mouth daily. 1 Package 11 06/21/2013 at Unknown time  . promethazine (PHENERGAN) 25 MG tablet Take 1 tablet (25 mg total) by mouth every 6 (six) hours as needed for nausea or vomiting. 12 tablet 0   . triamcinolone (KENALOG) 0.5 % cream Apply 1 application topically daily as needed. to affected area   Past Week at Unknown time    I have reviewed patient's Past Medical Hx, Surgical Hx, Family Hx, Social Hx, medications and allergies.  ROS:  Review of Systems  Constitutional: Negative for  chills.  Gastrointestinal: Negative for abdominal pain, constipation, diarrhea, nausea and vomiting.  Genitourinary: Positive for vaginal bleeding. Negative for pelvic pain.  Musculoskeletal: Negative for back pain.   Other systems negative     Physical Exam  Patient Vitals for the past 24 hrs:  BP Temp Temp src Pulse Resp SpO2 Weight  05/27/17 1900 109/68 98.7 F (37.1 C) Oral 96 16 100 % 177 lb 12 oz (80.6 kg)   Constitutional: Well-developed, well-nourished female in no acute distress.  Cardiovascular: normal rate and rhythm, no ectopy audible, S1 & S2 heard, no murmur Respiratory: normal effort, no distress. Lungs CTAB with no wheezes or crackles GI: Abd soft, non-tender.  Nondistended.  No rebound, No guarding.  Bowel Sounds audible  MS: Extremities nontender, no edema, normal ROM Neurologic: Alert and oriented x 4.   Grossly nonfocal. GU: Neg CVAT. Skin:  Warm and Dry Psych:  Affect appropriate.  PELVIC EXAM: Cervix pink, visually closed, without lesion, small amount of blood in vault, vaginal walls and external genitalia normal Bimanual exam: Cervix firm, anterior, neg CMT, uterus nontender, nonenlarged, adnexa without tenderness, enlargement, or mass    Labs:   none indicated.    Imaging:  No results found.  MAU Course/MDM: I have ordered labs as follows:none indicated.  Has not been bleeding heavily .  States hemoglobin was fine in other hospital Imaging ordered: none Results reviewed. Discussed possible etiologies. She is not really in risk group for cancer, so does not need biopsy.  Discussed source is likely anovulation.  Could be polyp but bleeding would probably be heavier. Therefore, Provera 10 day challenge is most likely to resolve this. Recommend no further estrogen  Treatments in MAU included none.   Pt stable at time of discharge.  Assessment: Abnormal Uterine Bleeding, likely due to anovulatory cycle  Plan: Discharge home Recommend bleeding  precautions.  If gets heavier or does not improve, come back here or to clinic Rx sent for Provera x 10 days  for DUB Pt will follow up at [facility] when she gets her new insurance Discussed walkin clinic on Mondays  Encouraged to return here or to other Urgent Care/ED if she develops worsening of symptoms, increase in pain, fever, or other concerning symptoms.   Wynelle Bourgeois CNM, MSN Certified Nurse-Midwife 05/27/2017 7:07 PM
